# Patient Record
Sex: Female | Born: 2000 | Hispanic: Yes | Marital: Single | Smoking: Never smoker
Health system: Southern US, Community
[De-identification: ages and names within clinical notes are randomized; demographics above are authoritative.]

## PROBLEM LIST (undated history)

## (undated) ENCOUNTER — Inpatient Hospital Stay (HOSPITAL_COMMUNITY): Payer: Self-pay

## (undated) DIAGNOSIS — Z8719 Personal history of other diseases of the digestive system: Secondary | ICD-10-CM

## (undated) DIAGNOSIS — D649 Anemia, unspecified: Secondary | ICD-10-CM

## (undated) HISTORY — PX: APPENDECTOMY: SHX54

## (undated) HISTORY — DX: Personal history of other diseases of the digestive system: Z87.19

---

## 2019-04-23 ENCOUNTER — Encounter (HOSPITAL_COMMUNITY): Payer: Self-pay | Admitting: Emergency Medicine

## 2019-04-23 ENCOUNTER — Other Ambulatory Visit: Payer: Self-pay

## 2019-04-23 ENCOUNTER — Emergency Department (HOSPITAL_COMMUNITY)
Admission: EM | Admit: 2019-04-23 | Discharge: 2019-04-23 | Disposition: A | Discharge status: Home / Self Care | Payer: Medicaid Other | Attending: Emergency Medicine | Admitting: Emergency Medicine

## 2019-04-23 DIAGNOSIS — K0889 Other specified disorders of teeth and supporting structures: Secondary | ICD-10-CM | POA: Diagnosis present

## 2019-04-23 DIAGNOSIS — K029 Dental caries, unspecified: Secondary | ICD-10-CM | POA: Diagnosis not present

## 2019-04-23 MED ORDER — HYDROCODONE-ACETAMINOPHEN 5-325 MG PO TABS: 2.0000 | ORAL_TABLET | ORAL | 0 refills | Status: DC | PRN

## 2019-04-23 MED ORDER — CLINDAMYCIN HCL 150 MG PO CAPS: 150.0000 mg | ORAL_CAPSULE | Freq: Four times a day (QID) | ORAL | 0 refills | Status: DC

## 2019-04-23 MED ORDER — ACETAMINOPHEN 500 MG PO TABS
1000.0000 mg | ORAL_TABLET | Freq: Once | ORAL | Status: AC
  Administered 2019-04-23: 1000 mg via ORAL
  Filled 2019-04-23: qty 2

## 2019-04-23 MED ORDER — BUPIVACAINE-EPINEPHRINE 0.5% -1:200000 IJ SOLN
10.0000 mL | Freq: Once | INTRAMUSCULAR | Status: AC
  Administered 2019-04-23: 12.5 mg
  Filled 2019-04-23: qty 10

## 2019-04-23 NOTE — ED Triage Notes (Signed)
Pt reports dental pain X1 month.  She actually has surgery scheduled for tomorrow (Wednesday) to have her molars removed.  She has been taking antibiotic which she believes "worked at first but the infection came back."

## 2019-04-23 NOTE — ED Provider Notes (Signed)
[facility]ENC[facility] Provider Note   CSN: [id number] Arrival date & time: 04/23/19  0423     History   Chief Complaint Chief Complaint  Patient presents with  . Dental Pain    HPI Barbara PinaReyna Anabel Adelina Montgomery is a [age] y.o. female.     18 year old who presents for dental pain.  Patient with dental pain x1 month.  Patient has seen her dentist and was prescribed amoxicillin and ibuprofen.  Initially these medications have helped but now the pain is worsening and the swelling is more intense.  No known fevers.  No other complaints.  No abdominal pain, no vomiting.  No chest pain.  The history is provided by the patient. A language interpreter was used.  Dental Pain Location:  Upper and lower Upper teeth location:  15/LU 2nd molar Lower teeth location:  18/LL 2nd molar Quality:  Constant Severity:  Moderate Onset quality:  Gradual Timing:  Constant Progression:  Worsening Chronicity:  New Context: dental caries and poor dentition   Previous work-up:  Dental exam Relieved by:  None tried Worsened by:  Cold food/drink Associated symptoms: facial pain, facial swelling and gum swelling   Associated symptoms: no difficulty swallowing, no drooling, no fever, no headaches, no neck swelling, no oral bleeding, no oral lesions and no trismus     History reviewed. No pertinent past medical history.  There are no active problems to display for this patient.   History reviewed. No pertinent surgical history.   OB History   No obstetric history on file.      Home Medications    Prior to Admission medications   Medication Sig Start Date End Date Taking? Authorizing Provider  clindamycin (CLEOCIN) 150 MG capsule Take 1 capsule (150 mg total) by mouth every 6 (six) hours. 04/23/19   Niel HummerKuhner, Alee Katen, MD  HYDROcodone-acetaminophen (NORCO/VICODIN) 5-325 MG tablet Take 2 tablets by mouth every 4 (four) hours as needed. 04/23/19   Niel HummerKuhner, Arieliz Latino, MD    Family  History No family history on file.  Social History Social History   Tobacco Use  . Smoking status: Not on file  Substance Use Topics  . Alcohol use: Not on file  . Drug use: Not on file     Allergies   Patient has no known allergies.   Review of Systems Review of Systems  Constitutional: Negative for fever.  HENT: Positive for facial swelling. Negative for drooling and mouth sores.   Neurological: Negative for headaches.  All other systems reviewed and are negative.    Physical Exam Updated Vital Signs BP 106/67 (BP Location: Right Arm)   Pulse 62   Temp 98.4 F (36.9 C) (Oral)   Resp 16   Wt 47 kg   SpO2 100%   Physical Exam Vitals signs and nursing note reviewed.  Constitutional:      Appearance: She is well-developed.  HENT:     Head: Normocephalic and atraumatic.     Right Ear: External ear normal.     Left Ear: External ear normal.     Nose:     Comments: Severe dental caries on left upper second molar and left lower second molar and right upper second molar.  Mild Swelling and moderate tenderness to left upper face and left lower face.   Eyes:     Conjunctiva/sclera: Conjunctivae normal.  Neck:     Musculoskeletal: Normal range of motion and neck supple.  Cardiovascular:     Rate and Rhythm: Normal rate.  Heart sounds: Normal heart sounds.  Pulmonary:     Effort: Pulmonary effort is normal.     Breath sounds: Normal breath sounds.  Abdominal:     General: Bowel sounds are normal.     Palpations: Abdomen is soft.     Tenderness: There is no abdominal tenderness. There is no rebound.  Musculoskeletal: Normal range of motion.  Skin:    General: Skin is warm.  Neurological:     Mental Status: She is alert and oriented to person, place, and time.      ED Treatments / Results  Labs (all labs ordered are listed, but only abnormal results are displayed) Labs Reviewed - No data to display  EKG None  Radiology No results found.   Procedures Dental Block  Date/Time: 04/23/2019 7:09 AM Performed by: Niel Hummer, MD Authorized by: Niel Hummer, MD   Consent:    Consent obtained:  Verbal   Consent given by:  Patient   Risks discussed:  Nerve damage, swelling, unsuccessful block and pain   Alternatives discussed:  Alternative treatment Universal protocol:    Procedure explained and questions answered to patient or proxy's satisfaction: yes     Immediately prior to procedure, a time out was called: yes     Patient identity confirmed:  Verbally with patient and arm band Indications:    Indications: dental pain   Location:    Block type:  Inferior alveolar   Laterality:  Left Procedure details (see MAR for exact dosages):    Syringe type:  Controlled syringe   Needle gauge:  25 G   Anesthetic injected:  Bupivacaine 0.5% WITH epi and lidocaine 2% WITH epi   Injection procedure:  Anatomic landmarks identified, introduced needle, incremental injection, anatomic landmarks palpated and negative aspiration for blood Post-procedure details:    Outcome:  Pain relieved   Patient tolerance of procedure:  Tolerated well, no immediate complications Dental Block  Date/Time: 04/23/2019 7:11 AM Performed by: Niel Hummer, MD Authorized by: Niel Hummer, MD   Consent:    Consent obtained:  Verbal   Consent given by:  Patient Indications:    Indications: dental pain   Location:    Block type:  Anterior superior alveolar Procedure details (see MAR for exact dosages):    Needle gauge:  25 G   Anesthetic injected:  Bupivacaine 0.5% WITH epi and lidocaine 2% WITH epi   Injection procedure:  Anatomic landmarks identified, anatomic landmarks palpated, introduced needle, negative aspiration for blood and incremental injection Post-procedure details:    Outcome:  Pain relieved   (including critical care time)  Medications Ordered in ED Medications  bupivacaine-EPINEPHrine (MARCAINE W/ EPI) 0.5% -1:200000 (with pres)  injection 50 mg (has no administration in time range)  acetaminophen (TYLENOL) tablet 1,000 mg (1,000 mg Oral Given 04/23/19 0502)     Initial Impression / Assessment and Plan / ED Course  I have reviewed the triage vital signs and the nursing notes.  Pertinent labs & imaging results that were available during my care of the patient were reviewed by me and considered in my medical decision making (see chart for details).        18 year old with dental pain.  Patient with significant dental caries.  Patient on amoxicillin.  Patient on ibuprofen.  I have offered to do a dental block.  Patient would like dental block.  She is asked for it on the left upper and left lower sides.  Nerve blocks done.  Patient achieved anesthesia and  pain control.  Will discharge home on clindamycin and Norco.  Will have patient follow-up with dentist tomorrow as scheduled.  Discussed signs that warrant reevaluation.  Final Clinical Impressions(s) / ED Diagnoses   Final diagnoses:  Pain due to dental caries    ED Discharge Orders         Ordered    HYDROcodone-acetaminophen (NORCO/VICODIN) 5-325 MG tablet  Every 4 hours PRN     04/23/19 0706    clindamycin (CLEOCIN) 150 MG capsule  Every 6 hours     04/23/19 0706           Louanne Skye, MD 04/23/19 [phone]

## 2019-04-23 NOTE — ED Notes (Signed)
ED Provider at bedside.  Dr. Kuhner into see patient 

## 2019-08-14 ENCOUNTER — Ambulatory Visit: Payer: Medicaid Other | Admitting: Family Medicine

## 2019-08-14 NOTE — Progress Notes (Deleted)
    SUBJECTIVE:   CHIEF COMPLAINT / HPI:   ***  PERTINENT  PMH / PSH: ***  OBJECTIVE:   There were no vitals taken for this visit.  ***  ASSESSMENT/PLAN:   No problem-specific Assessment & Plan notes found for this encounter.     Aubrea Meixner Autry-Lott, DO  [facility]   

## 2019-10-02 ENCOUNTER — Emergency Department (HOSPITAL_COMMUNITY): Payer: Medicaid Other

## 2019-10-02 ENCOUNTER — Encounter (HOSPITAL_COMMUNITY): Payer: Self-pay | Admitting: Emergency Medicine

## 2019-10-02 ENCOUNTER — Emergency Department (HOSPITAL_COMMUNITY)
Admission: EM | Admit: 2019-10-02 | Discharge: 2019-10-02 | Disposition: A | Discharge status: Home / Self Care | Payer: Medicaid Other | Attending: Emergency Medicine | Admitting: Emergency Medicine

## 2019-10-02 DIAGNOSIS — S301XXA Contusion of abdominal wall, initial encounter: Secondary | ICD-10-CM | POA: Insufficient documentation

## 2019-10-02 DIAGNOSIS — R079 Chest pain, unspecified: Secondary | ICD-10-CM | POA: Insufficient documentation

## 2019-10-02 DIAGNOSIS — S3092XA Unspecified superficial injury of abdominal wall, initial encounter: Secondary | ICD-10-CM | POA: Diagnosis present

## 2019-10-02 DIAGNOSIS — Y999 Unspecified external cause status: Secondary | ICD-10-CM | POA: Diagnosis not present

## 2019-10-02 DIAGNOSIS — Y939 Activity, unspecified: Secondary | ICD-10-CM | POA: Insufficient documentation

## 2019-10-02 DIAGNOSIS — X58XXXA Exposure to other specified factors, initial encounter: Secondary | ICD-10-CM | POA: Diagnosis not present

## 2019-10-02 DIAGNOSIS — Y929 Unspecified place or not applicable: Secondary | ICD-10-CM | POA: Insufficient documentation

## 2019-10-02 LAB — URINALYSIS, ROUTINE W REFLEX MICROSCOPIC
Bacteria, UA: NONE SEEN
Bilirubin Urine: NEGATIVE
Glucose, UA: NEGATIVE mg/dL
Hgb urine dipstick: NEGATIVE
Ketones, ur: NEGATIVE mg/dL
Nitrite: NEGATIVE
Protein, ur: NEGATIVE mg/dL
Specific Gravity, Urine: 1.009 (ref 1.005–1.030)
pH: 8 (ref 5.0–8.0)

## 2019-10-02 LAB — CBC
HCT: 37.4 % (ref 36.0–46.0)
Hemoglobin: 11.2 g/dL — ABNORMAL LOW (ref 12.0–15.0)
MCH: 25.1 pg — ABNORMAL LOW (ref 26.0–34.0)
MCHC: 29.9 g/dL — ABNORMAL LOW (ref 30.0–36.0)
MCV: 83.9 fL (ref 80.0–100.0)
Platelets: 303 10*3/uL (ref 150–400)
RBC: 4.46 MIL/uL (ref 3.87–5.11)
RDW: 15.3 % (ref 11.5–15.5)
WBC: 6.7 10*3/uL (ref 4.0–10.5)
nRBC: 0 % (ref 0.0–0.2)

## 2019-10-02 LAB — BASIC METABOLIC PANEL
Anion gap: 10 (ref 5–15)
BUN: 9 mg/dL (ref 6–20)
CO2: 25 mmol/L (ref 22–32)
Calcium: 8.8 mg/dL — ABNORMAL LOW (ref 8.9–10.3)
Chloride: 104 mmol/L (ref 98–111)
Creatinine, Ser: 0.69 mg/dL (ref 0.44–1.00)
GFR calc Af Amer: >60 mL/min (ref >60)
GFR calc non Af Amer: >60 mL/min (ref >60)
Glucose, Bld: 92 mg/dL (ref 70–99)
Potassium: 4.1 mmol/L (ref 3.5–5.1)
Sodium: 139 mmol/L (ref 135–145)

## 2019-10-02 LAB — HEPATIC FUNCTION PANEL
ALT: 15 U/L (ref 0–44)
AST: 25 U/L (ref 15–41)
Albumin: 3.6 g/dL (ref 3.5–5.0)
Alkaline Phosphatase: 114 U/L (ref 38–126)
Bilirubin, Direct: 0.1 mg/dL (ref 0.0–0.2)
Indirect Bilirubin: 0.6 mg/dL (ref 0.3–0.9)
Total Bilirubin: 0.7 mg/dL (ref 0.3–1.2)
Total Protein: 6.3 g/dL — ABNORMAL LOW (ref 6.5–8.1)

## 2019-10-02 LAB — LIPASE, BLOOD: Lipase: 27 U/L (ref 11–51)

## 2019-10-02 LAB — TROPONIN I (HIGH SENSITIVITY): Troponin I (High Sensitivity): 3 ng/L (ref <18)

## 2019-10-02 LAB — I-STAT BETA HCG BLOOD, ED (MC, WL, AP ONLY): I-stat hCG, quantitative: <5 m[IU]/mL (ref <5)

## 2019-10-02 LAB — D-DIMER, QUANTITATIVE: D-Dimer, Quant: <0.27 ug/mL-FEU (ref 0.00–0.50)

## 2019-10-02 MED ORDER — ACETAMINOPHEN 325 MG PO TABS: 650.0000 mg | ORAL_TABLET | Freq: Once | ORAL | Status: DC

## 2019-10-02 MED ORDER — SODIUM CHLORIDE 0.9% FLUSH: 3.0000 mL | Freq: Once | INTRAVENOUS | Status: DC

## 2019-10-02 MED ORDER — KETOROLAC TROMETHAMINE 15 MG/ML IJ SOLN: 15.0000 mg | Freq: Once | INTRAMUSCULAR | Status: DC

## 2019-10-02 NOTE — ED Provider Notes (Signed)
[facility] EMERGENCY DEPARTMENT Provider Note   CSN: [id number] Arrival date & time: 10/02/19  9381     History No chief complaint on file.   Barbara Montgomery is a 19 y.o. female.  Patient that hurts with coughing, moving, breathing.  Patient has history of appendectomy in [place].  Patient moved here approximately 6 months ago and took a long bus ride over 2 days.  Patient denies any known blood clot history, known cardiac history.  Patient has been doing difficult job lifting things and leaning against her ribs and abdomen area which may have been contributing.  Patient has no history of bleeding disorders.  Patient denies any cough or fever.  No known Covid contacts.  Patient feels safe where she lives who is with family.  No history of abuse.        History reviewed. No pertinent past medical history.  There are no problems to display for this patient.   History reviewed. No pertinent surgical history.   OB History   No obstetric history on file.     History reviewed. No pertinent family history.  Social History   Tobacco Use  . Smoking status: Never Smoker  . Smokeless tobacco: Never Used  Substance Use Topics  . Alcohol use: Not on file  . Drug use: Not on file    Home Medications Prior to Admission medications   Medication Sig Start Date End Date Taking? Authorizing Provider  clindamycin (CLEOCIN) 150 MG capsule Take 1 capsule (150 mg total) by mouth every 6 (six) hours. 04/23/19   Louanne Skye, MD  HYDROcodone-acetaminophen (NORCO/VICODIN) 5-325 MG tablet Take 2 tablets by mouth every 4 (four) hours as needed. 04/23/19   Louanne Skye, MD    Allergies    Patient has no known allergies.  Review of Systems   Review of Systems  Constitutional: Negative for chills and fever.  HENT: Negative for congestion.   Eyes: Negative for visual disturbance.  Respiratory: Negative for shortness of breath.   Cardiovascular: Positive for  chest pain. Negative for leg swelling.  Gastrointestinal: Negative for abdominal pain and vomiting.  Genitourinary: Negative for dysuria and flank pain.  Musculoskeletal: Negative for back pain, neck pain and neck stiffness.  Skin: Positive for wound. Negative for rash.  Neurological: Negative for light-headedness and headaches.    Physical Exam Updated Vital Signs BP (!) 99/55 (BP Location: Left Arm)   Pulse (!) 50   Temp 97.8 F (36.6 C) (Oral)   Resp 13   SpO2 100%   Physical Exam Vitals and nursing note reviewed.  Constitutional:      Appearance: She is well-developed. She is not ill-appearing.  HENT:     Head: Normocephalic and atraumatic.     Mouth/Throat:     Mouth: Mucous membranes are moist.  Eyes:     General:        Right eye: No discharge.        Left eye: No discharge.     Conjunctiva/sclera: Conjunctivae normal.  Neck:     Trachea: No tracheal deviation.  Cardiovascular:     Rate and Rhythm: Regular rhythm. Bradycardia present.  Pulmonary:     Effort: Pulmonary effort is normal.     Breath sounds: Normal breath sounds.  Abdominal:     General: There is no distension.     Palpations: Abdomen is soft.     Tenderness: There is no abdominal tenderness. There is no guarding.  Musculoskeletal:  General: Tenderness present. No swelling.     Cervical back: Normal range of motion and neck supple. No rigidity.  Skin:    General: Skin is warm.     Capillary Refill: Capillary refill takes less than 2 seconds.     Findings: Bruising present. No rash.     Comments: Patient has multiple smaller bruises approximately 2 cm diameter right upper abdominal and epigastric region.  No focal tenderness at the sites.  Patient has mild tenderness palpation of right anterior lateral rib region no step-off.  Neurological:     General: No focal deficit present.     Mental Status: She is alert and oriented to person, place, and time.  Psychiatric:        Mood and Affect:  Mood normal.     ED Results / Procedures / Treatments   Labs (all labs ordered are listed, but only abnormal results are displayed) Labs Reviewed  BASIC METABOLIC PANEL - Abnormal; Notable for the following components:      Result Value   Calcium 8.8 (*)    All other components within normal limits  CBC - Abnormal; Notable for the following components:   Hemoglobin 11.2 (*)    MCH 25.1 (*)    MCHC 29.9 (*)    All other components within normal limits  URINALYSIS, ROUTINE W REFLEX MICROSCOPIC - Abnormal; Notable for the following components:   Color, Urine STRAW (*)    Leukocytes,Ua TRACE (*)    All other components within normal limits  D-DIMER, QUANTITATIVE (NOT AT [facility])  HEPATIC FUNCTION PANEL  LIPASE, BLOOD  I-STAT BETA HCG BLOOD, ED ([facility], [facility], [facility] ONLY)  TROPONIN I (HIGH SENSITIVITY)    EKG EKG Interpretation  Date/Time:  Wednesday Oct 02 2019 10:52:43 EDT Ventricular Rate:  52 PR Interval:  132 QRS Duration: 79 QT Interval:  410 QTC Calculation: 382 R Axis:   64 Text Interpretation: Sinus rhythm Borderline short PR interval Confirmed by Blane Ohara [phone]) on 10/02/2019 11:42:07 AM   Radiology DG Chest 2 View  Result Date: 10/02/2019 CLINICAL DATA:  Right-sided chest pain. EXAM: CHEST - 2 VIEW COMPARISON:  None. FINDINGS: The cardiac silhouette, mediastinal and hilar contours are normal. The lungs are clear. Artifact noted from EKG pads over the upper chest. The bony thorax is intact. IMPRESSION: No acute cardiopulmonary findings. Electronically Signed   By: Rudie Meyer M.D.   On: 10/02/2019 08:09    Procedures Procedures (including critical care time)  Medications Ordered in ED Medications  sodium chloride flush (NS) 0.9 % injection 3 mL (has no administration in time range)  acetaminophen (TYLENOL) tablet 650 mg (650 mg Oral Not Given 10/02/19 1139)    ED Course  I have reviewed the triage vital signs and the nursing notes.  Pertinent labs & imaging  results that were available during my care of the patient were reviewed by me and considered in my medical decision making (see chart for details).    MDM Rules/Calculators/[name]                      Patient presents with right-sided chest pain with differential including musculoskeletal especially since it is reproducible and worse with movement and breathing along with the bruising, with recent long bus trip blood clot however she has no history or other reasons for blood clots, pneumonia however no productive cough or fever, other. Blood work was reviewed Hb 11.2, normal white blood cell count, normal troponin. D-dimer sent  and reviewed normal. EKG initially tachycardic and pain improved and repeat EKG mild bradycardia which is likely her baseline however no old to compare to. A translator was used for discussion and female representative for right breast examination which revealed no tenderness or signs of infection or masses palpated. Urinalysis no sign of significant infection. Myself in addition to nursing staff on 2 different occasions ask questions to ensure her safety and she says she feels safe where she lives she lives with family, she understands that if anyone is trying to hurt her she can get help and she had appropriate response to these questions. Chest x-ray reviewed no acute abnormalities. Patient stable for close outpatient follow-up, stressed follow-up with family medicine as she will need continued care and reassessment. Final Clinical Impression(s) / ED Diagnoses Final diagnoses:  Right-sided chest pain  Traumatic ecchymosis of abdominal wall, initial encounter    Rx / DC Orders ED Discharge Orders    None       Blane Ohara, MD 10/06/19 2355

## 2019-10-02 NOTE — Discharge Instructions (Addendum)
It is very important for you to follow-up with your primary care doctor for further evaluation of your pain and bruising.  If you develop significant shortness of breath, persistent fevers, passout or new concerns return to the emergency department.  Es muy importante que realice un seguimiento con su mdico de atencin primaria para una evaluacin adicional de su dolor y hematomas. Si presenta falta de aire significativa, fiebres persistentes, desmayos o nuevas preocupaciones, regrese al departamento de emergencias.

## 2019-10-02 NOTE — ED Triage Notes (Signed)
Pt here with c/o right side chest pain hurts when she coughs and takes a deep breath she also feels like they make be a small bump under her right breast

## 2020-05-16 NOTE — L&D Delivery Note (Addendum)
Delivery Note:   G1P0 at [redacted]w[redacted]d  Admitting diagnosis: Cholestasis during pregnancy in third trimester [O26.613, K83.1]  Risks:  Cholestasis Iron Deficiency Anemia  Onset of labor: 0636 IOL/Augmentation: Cytotec, Pitocin and IP Foley ROM: unknown  Complete dilation at 04/04/2021  0744 Onset of pushing at 0745 FHR second stage 165  Analgesia /Anesthesia intrapartum:Epidural  Pushing in lithotomy position with CNM and [name] staff support, significant other and mother present for birth and supportive.  Delivery of a Live born female  Birth Weight:   APGAR: 8, 9  Newborn Delivery   Birth date/time: 04/04/2021 08:29:00 Delivery type: Vaginal, Spontaneous      in cephalic presentation, ROA.  APGAR:1 min-8 , 5 min-9   Nuchal Cord: No  Cord double clamped after cessation of pulsation, cut by father.  Collection of cord blood for typing completed. Cord blood donation-None  Arterial cord blood sample-No    Placenta delivered-Spontaneous;Pathology  with 3 vessels . Uterotonics: Pitocin Placenta to pathology  Uterine tone Firm  Bleeding Mild  1st degree  laceration identified.  Episiotomy:None  Local analgesia: Epidural  Repair: 3.0 suture  Est. Blood Loss (mL):300.00   Complications: Intrauterine Inflammation or infection (Chorioamniotis)  Mom to postpartum.  Baby girl to Couplet care / Skin to Skin.  Delivery Report:  Review the Delivery Report for details.    Trellis Moment, SNM 04/04/2021, 8:57 AM   I was gloved and present for delivery in its entirety.  Second stage of labor progressed, baby delivered after 30 minutes. Slight maternal effort dysotica with delivery of head but not shoulder dystocia.    Complications: none  Lacerations: 1st degree perineal, repaired with my guidance by SNM  EBL: 300  Rolm Bookbinder, CNM 11:03 AM

## 2020-09-01 ENCOUNTER — Encounter (HOSPITAL_COMMUNITY): Payer: Self-pay | Admitting: Obstetrics and Gynecology

## 2020-09-01 ENCOUNTER — Other Ambulatory Visit: Payer: Self-pay

## 2020-09-01 ENCOUNTER — Inpatient Hospital Stay (HOSPITAL_COMMUNITY): Payer: Medicaid Other

## 2020-09-01 ENCOUNTER — Inpatient Hospital Stay (HOSPITAL_COMMUNITY)
Admission: AD | Admit: 2020-09-01 | Discharge: 2020-09-01 | Disposition: A | Discharge status: Home / Self Care | Payer: Medicaid Other | Attending: Obstetrics and Gynecology | Admitting: Obstetrics and Gynecology

## 2020-09-01 DIAGNOSIS — O26891 Other specified pregnancy related conditions, first trimester: Secondary | ICD-10-CM | POA: Diagnosis not present

## 2020-09-01 DIAGNOSIS — R300 Dysuria: Secondary | ICD-10-CM | POA: Diagnosis not present

## 2020-09-01 DIAGNOSIS — R42 Dizziness and giddiness: Secondary | ICD-10-CM | POA: Diagnosis not present

## 2020-09-01 DIAGNOSIS — R1013 Epigastric pain: Secondary | ICD-10-CM | POA: Diagnosis not present

## 2020-09-01 DIAGNOSIS — R102 Pelvic and perineal pain: Secondary | ICD-10-CM | POA: Diagnosis not present

## 2020-09-01 DIAGNOSIS — Z3A01 Less than 8 weeks gestation of pregnancy: Secondary | ICD-10-CM | POA: Diagnosis not present

## 2020-09-01 DIAGNOSIS — O219 Vomiting of pregnancy, unspecified: Secondary | ICD-10-CM | POA: Diagnosis not present

## 2020-09-01 DIAGNOSIS — R350 Frequency of micturition: Secondary | ICD-10-CM | POA: Insufficient documentation

## 2020-09-01 DIAGNOSIS — Z8719 Personal history of other diseases of the digestive system: Secondary | ICD-10-CM | POA: Insufficient documentation

## 2020-09-01 DIAGNOSIS — R109 Unspecified abdominal pain: Secondary | ICD-10-CM | POA: Diagnosis not present

## 2020-09-01 LAB — COMPREHENSIVE METABOLIC PANEL
ALT: 13 U/L (ref 0–44)
AST: 20 U/L (ref 15–41)
Albumin: 3.8 g/dL (ref 3.5–5.0)
Alkaline Phosphatase: 57 U/L (ref 38–126)
Anion gap: 9 (ref 5–15)
BUN: 5 mg/dL — ABNORMAL LOW (ref 6–20)
CO2: 23 mmol/L (ref 22–32)
Calcium: 9.1 mg/dL (ref 8.9–10.3)
Chloride: 101 mmol/L (ref 98–111)
Creatinine, Ser: 0.51 mg/dL (ref 0.44–1.00)
GFR, Estimated: >60 mL/min (ref >60)
Glucose, Bld: 85 mg/dL (ref 70–99)
Potassium: 3.8 mmol/L (ref 3.5–5.1)
Sodium: 133 mmol/L — ABNORMAL LOW (ref 135–145)
Total Bilirubin: 0.7 mg/dL (ref 0.3–1.2)
Total Protein: 6.5 g/dL (ref 6.5–8.1)

## 2020-09-01 LAB — URINALYSIS, ROUTINE W REFLEX MICROSCOPIC
Bilirubin Urine: NEGATIVE
Glucose, UA: NEGATIVE mg/dL
Hgb urine dipstick: NEGATIVE
Ketones, ur: 80 mg/dL — AB
Leukocytes,Ua: NEGATIVE
Nitrite: NEGATIVE
Protein, ur: NEGATIVE mg/dL
Specific Gravity, Urine: 1.016 (ref 1.005–1.030)
pH: 7 (ref 5.0–8.0)

## 2020-09-01 LAB — CBC
HCT: 38.7 % (ref 36.0–46.0)
Hemoglobin: 12.3 g/dL (ref 12.0–15.0)
MCH: 25.9 pg — ABNORMAL LOW (ref 26.0–34.0)
MCHC: 31.8 g/dL (ref 30.0–36.0)
MCV: 81.6 fL (ref 80.0–100.0)
Platelets: 323 10*3/uL (ref 150–400)
RBC: 4.74 MIL/uL (ref 3.87–5.11)
RDW: 14.3 % (ref 11.5–15.5)
WBC: 9.4 10*3/uL (ref 4.0–10.5)
nRBC: 0 % (ref 0.0–0.2)

## 2020-09-01 LAB — ABO/RH: ABO/RH(D): O POS

## 2020-09-01 LAB — HCG, QUANTITATIVE, PREGNANCY: hCG, Beta Chain, Quant, S: 93287 m[IU]/mL — ABNORMAL HIGH (ref <5)

## 2020-09-01 LAB — WET PREP, GENITAL
Sperm: NONE SEEN
Trich, Wet Prep: NONE SEEN
Yeast Wet Prep HPF POC: NONE SEEN

## 2020-09-01 LAB — OB RESULTS CONSOLE ANTIBODY SCREEN: Antibody Screen: NEGATIVE

## 2020-09-01 LAB — LIPASE, BLOOD: Lipase: 25 U/L (ref 11–51)

## 2020-09-01 MED ORDER — FAMOTIDINE IN NACL 20-0.9 MG/50ML-% IV SOLN
20.0000 mg | Freq: Once | INTRAVENOUS | Status: AC
  Administered 2020-09-01: 20 mg via INTRAVENOUS
  Filled 2020-09-01: qty 50

## 2020-09-01 MED ORDER — METOCLOPRAMIDE HCL 5 MG/ML IJ SOLN
10.0000 mg | Freq: Once | INTRAMUSCULAR | Status: AC
  Administered 2020-09-01: 10 mg via INTRAVENOUS
  Filled 2020-09-01: qty 2

## 2020-09-01 MED ORDER — PROMETHAZINE HCL 25 MG PO TABS: 25.0000 mg | ORAL_TABLET | Freq: Three times a day (TID) | ORAL | 0 refills | Status: DC | PRN

## 2020-09-01 MED ORDER — FAMOTIDINE 20 MG PO TABS: 20.0000 mg | ORAL_TABLET | Freq: Every day | ORAL | 1 refills | Status: DC

## 2020-09-01 MED ORDER — SODIUM CHLORIDE 0.9 % IV BOLUS
1000.0000 mL | Freq: Once | INTRAVENOUS | Status: AC
  Administered 2020-09-01: 1000 mL via INTRAVENOUS

## 2020-09-01 MED ORDER — DOXYLAMINE-PYRIDOXINE 10-10 MG PO TBEC: DELAYED_RELEASE_TABLET | ORAL | 0 refills | Status: DC

## 2020-09-01 NOTE — MAU Provider Note (Signed)
Chief Complaint:  Abdominal Pain, Nausea, Emesis, and Dizziness  Spanish interpreter in person used for the duration of the visit.    HPI: Barbara Montgomery is a 19 y.o. G1P0 at [redacted]w[redacted]d via LMP who presents to maternity admissions reporting abdominal pain. Denies vaginal bleeding, fever, falls, or recent illness. Accompanied by her partner.   Woke up this morning with lightheadedness and vomited x2 (Clear and foamy). Upper (burning, radiates up her chest) x 3 days/worsening and suprapubic non-radiating constant abdominal pain ("like something walking on top or sharp") for past three weeks. Still nauseous, last vomited around 0800. She has not eaten or drank anything since then. Nausea since yesterday, vomiting only today. Has been nauseous for a few weeks. Upper ab pain intermittent, worse in the morning, not specifically worse after eating.  +Dysuria for the past three weeks, urinary frequency. Denies urgency, fever, or back pain. Has not tried any medications or anything other modalities to make it better. No provoking symptoms she is aware of.  No vaginal itching/irritation, or changes in vaginal discharge.   Previous had a similar upper abdominal pain when she lived in [place], told it was gastritis.   Will receive pre-natal care at [facility]. First appt 10/02/2020. No U/S.   Pregnancy Course:   History reviewed. No pertinent past medical history. OB History  Gravida Para Term Preterm AB Living  1            SAB IAB Ectopic Multiple Live Births               # Outcome Date GA Lbr Len/2nd Weight Sex Delivery Anes PTL Lv  1 Current            Past Surgical History:  Procedure Laterality Date  . APPENDECTOMY     History reviewed. No pertinent family history. Social History   Tobacco Use  . Smoking status: Never Smoker  . Smokeless tobacco: Never Used  Vaping Use  . Vaping Use: Never used  Substance Use Topics  . Alcohol use: Never  . Drug use: Never   No Known  Allergies Medications Prior to Admission  Medication Sig Dispense Refill Last Dose  . clindamycin (CLEOCIN) 150 MG capsule Take 1 capsule (150 mg total) by mouth every 6 (six) hours. 28 capsule 0   . HYDROcodone-acetaminophen (NORCO/VICODIN) 5-325 MG tablet Take 2 tablets by mouth every 4 (four) hours as needed. 10 tablet 0     I have reviewed patient's Past Medical Hx, Surgical Hx, Family Hx, Social Hx, medications and allergies.   ROS:  Review of Systems  Constitutional: Negative for chills, fatigue and fever.  Cardiovascular: Negative for chest pain and palpitations.  Gastrointestinal: Positive for abdominal pain, nausea and vomiting. Negative for constipation and diarrhea.  Genitourinary: Positive for dysuria, frequency and pelvic pain. Negative for flank pain, hematuria, urgency, vaginal bleeding and vaginal discharge.  Musculoskeletal: Negative for back pain.  Neurological: Positive for light-headedness. Negative for syncope, weakness and headaches.    Physical Exam   Patient Vitals for the past 24 hrs:  BP Temp Temp src Pulse Resp SpO2 Weight  09/01/20 1229 (!) 109/42 -- -- 79 -- -- --  09/01/20 1212 (!) 100/52 98 F (36.7 C) Oral 77 16 100 % --  09/01/20 1209 -- -- -- -- -- -- 52.7 kg  09/01/20 1046 (!) 106/45 (!) 97.5 F (36.4 C) Oral 88 16 100 % --    Constitutional: Well-developed, well-nourished female in no acute distress.  HEENT: MM  slightly dry  Cardiovascular: normal rate & rhythm, no murmur Respiratory: normal effort, lung sounds clear throughout GI: Abd soft and non-distended. Mild epigastric tenderness to deep palpation without rebounding or guarding. Tender to suprapubic region with voluntary guarding, no rebounding or involuntary guarding. No peritoneal signs. No murphy's or Mcburney's point tenderness. Negative roving's.  MS: Extremities nontender, no edema, normal ROM Neurologic: Alert and oriented x 4.  GU: no CVA tenderness b/l     Fetal Tracing: N/A    Labs: Results for orders placed or performed during the hospital encounter of 09/01/20 (from the past 24 hour(s))  Urinalysis, Routine w reflex microscopic Urine, Clean Catch     Status: Abnormal   Collection Time: 09/01/20  1:20 PM  Result Value Ref Range   Color, Urine YELLOW YELLOW   APPearance HAZY (A) CLEAR   Specific Gravity, Urine 1.016 1.005 - 1.030   pH 7.0 5.0 - 8.0   Glucose, UA NEGATIVE NEGATIVE mg/dL   Hgb urine dipstick NEGATIVE NEGATIVE   Bilirubin Urine NEGATIVE NEGATIVE   Ketones, ur 80 (A) NEGATIVE mg/dL   Protein, ur NEGATIVE NEGATIVE mg/dL   Nitrite NEGATIVE NEGATIVE   Leukocytes,Ua NEGATIVE NEGATIVE  Wet prep, genital     Status: Abnormal   Collection Time: 09/01/20  1:40 PM   Specimen: PATH Cytology Cervicovaginal Ancillary Only  Result Value Ref Range   Yeast Wet Prep HPF POC NONE SEEN NONE SEEN   Trich, Wet Prep NONE SEEN NONE SEEN   Clue Cells Wet Prep HPF POC PRESENT (A) NONE SEEN   WBC, Wet Prep HPF POC MANY (A) NONE SEEN   Sperm NONE SEEN   CBC     Status: Abnormal   Collection Time: 09/01/20  2:04 PM  Result Value Ref Range   WBC 9.4 4.0 - 10.5 K/uL   RBC 4.74 3.87 - 5.11 MIL/uL   Hemoglobin 12.3 12.0 - 15.0 g/dL   HCT 80.8 81.1 - 03.1 %   MCV 81.6 80.0 - 100.0 fL   MCH 25.9 (L) 26.0 - 34.0 pg   MCHC 31.8 30.0 - 36.0 g/dL   RDW 59.4 58.5 - 92.9 %   Platelets 323 150 - 400 K/uL   nRBC 0.0 0.0 - 0.2 %  ABO/Rh     Status: None   Collection Time: 09/01/20  2:04 PM  Result Value Ref Range   ABO/RH(D) O POS    No rh immune globuloin      NOT A RH IMMUNE GLOBULIN CANDIDATE, PT RH POSITIVE Performed at [facility], [address] [place], [place] [address]   hCG, quantitative, pregnancy     Status: Abnormal   Collection Time: 09/01/20  2:04 PM  Result Value Ref Range   hCG, Beta Chain, Quant, S 93,287 (H) <5 mIU/mL  Comprehensive metabolic panel     Status: Abnormal   Collection Time: 09/01/20  2:04 PM  Result Value Ref Range    Sodium 133 (L) 135 - 145 mmol/L   Potassium 3.8 3.5 - 5.1 mmol/L   Chloride 101 98 - 111 mmol/L   CO2 23 22 - 32 mmol/L   Glucose, Bld 85 70 - 99 mg/dL   BUN 5 (L) 6 - 20 mg/dL   Creatinine, Ser 8.63 0.44 - 1.00 mg/dL   Calcium 9.1 8.9 - 81.7 mg/dL   Total Protein 6.5 6.5 - 8.1 g/dL   Albumin 3.8 3.5 - 5.0 g/dL   AST 20 15 - 41 U/L  ALT 13 0 - 44 U/L   Alkaline Phosphatase 57 38 - 126 U/L   Total Bilirubin 0.7 0.3 - 1.2 mg/dL   GFR, Estimated >99 >24 mL/min   Anion gap 9 5 - 15  Lipase, blood     Status: None   Collection Time: 09/01/20  2:04 PM  Result Value Ref Range   Lipase 25 11 - 51 U/L    Imaging:  US OB LESS THAN 14 WEEKS WITH OB TRANSVAGINAL  Result Date: 09/01/2020 CLINICAL DATA:  Abdominal pain in first trimester of pregnancy, LMP 07/18/2020 EXAM: OBSTETRIC <14 WK [place] AND TRANSVAGINAL OB US TECHNIQUE: Both transabdominal and transvaginal ultrasound examinations were performed for complete evaluation of the gestation as well as the maternal uterus, adnexal regions, and pelvic cul-de-sac. Transvaginal technique was performed to assess early pregnancy. COMPARISON:  None FINDINGS: Intrauterine gestational sac: Present, single Yolk sac:  Present Embryo:  Present Cardiac Activity: Present Heart Rate: 127 bpm CRL:  6.4 mm   6 w   3 d                  [place] EDC: 04/24/2021 Subchorionic hemorrhage:  Small subchronic hemorrhage Maternal uterus/adnexae: Remainder of uterus unremarkable. Ovaries normal appearance. No free pelvic fluid or adnexal masses. IMPRESSION: Single live intrauterine gestation at 6 weeks 3 days EGA by crown-rump length. Small subchronic hemorrhage. Electronically Signed   By: Ulyses Southward M.D.   On: 09/01/2020 15:01    MAU Course/MDM: Orders Placed This Encounter  Procedures  . Wet prep, genital  . OB Urine Culture  . US OB LESS THAN 14 WEEKS WITH OB TRANSVAGINAL  . Urinalysis, Routine w reflex microscopic  . CBC  . hCG, quantitative, pregnancy  . Comprehensive  metabolic panel  . Lipase, blood  . Diet NPO time specified  . ABO/Rh   Meds ordered this encounter  Medications  . sodium chloride 0.9 % bolus 1,000 mL  . metoCLOPramide (REGLAN) injection 10 mg  . famotidine (PEPCID) IVPB 20 mg premix  . Doxylamine-Pyridoxine 10-10 MG TBEC    Sig: Take 2 tabs at bedtime. If needed, add another tab in the morning. If needed, add another tab in the afternoon, up to 4 tabs/day.    Dispense:  60 tablet    Refill:  0    DAW 9  . promethazine (PHENERGAN) 25 MG tablet    Sig: Take 1 tablet (25 mg total) by mouth every 8 (eight) hours as needed for refractory nausea / vomiting.    Dispense:  15 tablet    Refill:  0  . famotidine (PEPCID) 20 MG tablet    Sig: Take 1 tablet (20 mg total) by mouth daily.    Dispense:  30 tablet    Refill:  1   1600: Recheck patient after medication given, accompanied by spanish interpreter. Upper abdominal pain resolved, minimal suprapubic remaining. No longer nauseous. Requests crackers, tolerated graham crackers and salines without emesis. No emesis since MAU arrival. Abdomen soft without tenderness on repeat exam.    Assessment/Plan: 1. Abdominal pain during pregnancy in first trimester   2. Nausea and vomiting in pregnancy    #Acute epigastric abdominal pain, associated N/V: Suspect likely pregnancy related N/V with GERD. Reassuring lipase and hepatic function WNL, suggesting against pancreatitis/liver dysfunction. Doubt gallbladder etiology given description and location. Evidence of dehydration with U/A (ketones 80, no glucose) without electrolyte abnormality and appears mildly dehydrated on exam, given IVF bolus, reglan, and pepcid with resolution. Rx'd  Diclegis to start tonight, PRN phenergan for severe N/V, and pepcid daily for reflux. Tums PRN.   #Suprapubic abdominal pain: Subacute. Non-acute abdomen. U/A not consistent with infection, however will send for culture, dehydration likely contributing to dysuria.  Intrauterine pregnancy ([id number]) confirmed on U/S, consistent with LMP. Wet prep +clue cells, however asymptomatic, will hold off on treatment. F/u Gc/ch, low suspicion. Pain resolved following intervention as above.    Discharged home in stable condition, partner present to drive patient home. MAU precautions discussed, f/u with [facility] for prenatal care.     Allergies as of 09/01/2020   No Known Allergies     Medication List    STOP taking these medications   clindamycin 150 MG capsule Commonly known as: CLEOCIN   HYDROcodone-acetaminophen 5-325 MG tablet Commonly known as: NORCO/VICODIN     TAKE these medications   Doxylamine-Pyridoxine 10-10 MG Tbec Take 2 tabs at bedtime. If needed, add another tab in the morning. If needed, add another tab in the afternoon, up to 4 tabs/day.   famotidine 20 MG tablet Commonly known as: PEPCID Take 1 tablet (20 mg total) by mouth daily.   promethazine 25 MG tablet Commonly known as: PHENERGAN Take 1 tablet (25 mg total) by mouth every 8 (eight) hours as needed for refractory nausea / vomiting.       Leticia PennaSamantha Shakiyah Cirilo, DO  Family Medicine PGY-3

## 2020-09-01 NOTE — Discharge Instructions (Signed)
If any severe abdominal pain, inability to maintain hydration (recurrent nausea/vomiting and not keeping any fluids or food down), or vaginal bleeding- please return to MAU.   Si tiene dolor abdominal intenso, incapacidad para Photographer hidratacin (nuseas/vmitos recurrentes y falta de retencin de lquidos o alimentos) o sangrado vaginal, regrese a MAU.  Dolor abdominal durante el embarazo Abdominal Pain During Pregnancy El dolor de vientre (abdominal) es habitual durante el embarazo. Hay muchas causas posibles. Algunas causas son ms graves que otras. Algunas veces, la causa no se conoce. Siempre comunquese con su mdico si tiene dolor abdominal. Siga estas instrucciones en su casa:  No tenga relaciones sexuales ni se coloque nada dentro de la vagina hasta que el dolor haya desaparecido completamente.  Descanse todo lo que pueda [employer] dolor se le haya calmado.  Beba suficiente lquido para Radio producer pis (orina) de color amarillo plido.  Use los medicamentos de venta libre y los recetados solamente como se lo haya indicado el mdico.  Cumpla con todas las visitas de seguimiento.   Comunquese con un mdico si:  Sigue sintiendo dolor despus de descansar.  El dolor empeora despus de descansar.  Siente dolor en la parte inferior del abdomen que: ? Va y viene en intervalos regulares. ? Se extiende a la espalda. ? Se siente como clicos menstruales.  Siente dolor o ardor al hacer pis (orinar). Solicite ayuda de inmediato si:  Tiene fiebre o escalofros.  Siente que le Air traffic controller.  Tiene sangrado vaginal.  Tiene prdida de lquidos o tejidos por la vagina.  Vomita durante ms de 24horas.  Tiene heces lquidas (diarrea) durante ms de 24horas.  El beb se mueve menos de lo habitual.  Se siente dbil o se desmaya.  Tiene dolor muy intenso en la parte superior del abdomen. Resumen  El dolor de vientre es habitual durante el [place]. Hay muchas  causas posibles.  Si tiene Surveyor, quantity abdomen durante el embarazo, avise al mdico de inmediato.  Cumpla con todas las visitas de seguimiento. Esta informacin no tiene Theme park manager el consejo del mdico. Asegrese de hacerle al mdico cualquier pregunta que tenga. Document Revised: 02/20/2020 Document Reviewed: 02/20/2020 Elsevier Patient Education  2021 [employer].

## 2020-09-01 NOTE — MAU Provider Note (Signed)
History     CSN: [id number]  Arrival date and time: 09/01/20 1037   Event Date/Time   First Provider Initiated Contact with Patient 09/01/20 1258     *Spanish interpreter at bedside for this encounter*  Chief Complaint  Patient presents with  . Abdominal Pain  . Nausea  . Emesis  . Dizziness   HPI  Barbara Montgomery is a 20 y.o. G1P0 at [redacted]w[redacted]d who presents with n/v & abdominal cramping. Reports vomiting twice today. Does not have antiemetic at home. Also endorses some epigastric burning associated with the nausea & vomiting. Has felt dizzy today since vomiting but denies syncopal episode.  Reports intermittent lower abdominal cramping for the last 3 days. Rates pain 7/10. Hasn't treated symptoms. Nothing makes better or worse. Denies dysuria, vaginal bleeding, vaginal discharge, or fever. Going to [facility] for prenatal care.   OB History    Gravida  1   Para      Term      Preterm      AB      Living        SAB      IAB      Ectopic      Multiple      Live Births              History reviewed. No pertinent past medical history.  Past Surgical History:  Procedure Laterality Date  . APPENDECTOMY      History reviewed. No pertinent family history.  Social History   Tobacco Use  . Smoking status: Never Smoker  . Smokeless tobacco: Never Used  Vaping Use  . Vaping Use: Never used  Substance Use Topics  . Alcohol use: Never  . Drug use: Never    Allergies: No Known Allergies  Medications Prior to Admission  Medication Sig Dispense Refill Last Dose  . clindamycin (CLEOCIN) 150 MG capsule Take 1 capsule (150 mg total) by mouth every 6 (six) hours. 28 capsule 0   . HYDROcodone-acetaminophen (NORCO/VICODIN) 5-325 MG tablet Take 2 tablets by mouth every 4 (four) hours as needed. 10 tablet 0     Review of Systems  Constitutional: Negative.   Gastrointestinal: Positive for abdominal pain, nausea and vomiting. Negative for constipation and  diarrhea.  Genitourinary: Negative.   Neurological: Positive for dizziness. Negative for syncope.   Physical Exam   Blood pressure (!) 109/42, pulse 79, temperature 98 F (36.7 C), temperature source Oral, resp. rate 16, weight 52.7 kg, last menstrual period 07/18/2020, SpO2 100 %.  Physical Exam Vitals and nursing note reviewed.  Constitutional:      General: She is not in acute distress.    Appearance: She is well-developed and normal weight.  HENT:     Head: Normocephalic and atraumatic.  Pulmonary:     Effort: Pulmonary effort is normal. No respiratory distress.  Skin:    General: Skin is warm and dry.  Neurological:     Mental Status: She is alert.  Psychiatric:        Mood and Affect: Mood normal.        Behavior: Behavior normal.     MAU Course  Procedures Results for orders placed or performed during the hospital encounter of 09/01/20 (from the past 24 hour(s))  Urinalysis, Routine w reflex microscopic Urine, Clean Catch     Status: Abnormal   Collection Time: 09/01/20  1:20 PM  Result Value Ref Range   Color, Urine YELLOW YELLOW   APPearance HAZY (  A) CLEAR   Specific Gravity, Urine 1.016 1.005 - 1.030   pH 7.0 5.0 - 8.0   Glucose, UA NEGATIVE NEGATIVE mg/dL   Hgb urine dipstick NEGATIVE NEGATIVE   Bilirubin Urine NEGATIVE NEGATIVE   Ketones, ur 80 (A) NEGATIVE mg/dL   Protein, ur NEGATIVE NEGATIVE mg/dL   Nitrite NEGATIVE NEGATIVE   Leukocytes,Ua NEGATIVE NEGATIVE  Wet prep, genital     Status: Abnormal   Collection Time: 09/01/20  1:40 PM   Specimen: PATH Cytology Cervicovaginal Ancillary Only  Result Value Ref Range   Yeast Wet Prep HPF POC NONE SEEN NONE SEEN   Trich, Wet Prep NONE SEEN NONE SEEN   Clue Cells Wet Prep HPF POC PRESENT (A) NONE SEEN   WBC, Wet Prep HPF POC MANY (A) NONE SEEN   Sperm NONE SEEN   CBC     Status: Abnormal   Collection Time: 09/01/20  2:04 PM  Result Value Ref Range   WBC 9.4 4.0 - 10.5 K/uL   RBC 4.74 3.87 - 5.11  MIL/uL   Hemoglobin 12.3 12.0 - 15.0 g/dL   HCT 46.2 70.3 - 50.0 %   MCV 81.6 80.0 - 100.0 fL   MCH 25.9 (L) 26.0 - 34.0 pg   MCHC 31.8 30.0 - 36.0 g/dL   RDW 93.8 18.2 - 99.3 %   Platelets 323 150 - 400 K/uL   nRBC 0.0 0.0 - 0.2 %  ABO/Rh     Status: None   Collection Time: 09/01/20  2:04 PM  Result Value Ref Range   ABO/RH(D) O POS    No rh immune globuloin      NOT A RH IMMUNE GLOBULIN CANDIDATE, PT RH POSITIVE Performed at [facility], [address] [place], [place] [address]   hCG, quantitative, pregnancy     Status: Abnormal   Collection Time: 09/01/20  2:04 PM  Result Value Ref Range   hCG, Beta Chain, Quant, S 93,287 (H) <5 mIU/mL  Comprehensive metabolic panel     Status: Abnormal   Collection Time: 09/01/20  2:04 PM  Result Value Ref Range   Sodium 133 (L) 135 - 145 mmol/L   Potassium 3.8 3.5 - 5.1 mmol/L   Chloride 101 98 - 111 mmol/L   CO2 23 22 - 32 mmol/L   Glucose, Bld 85 70 - 99 mg/dL   BUN 5 (L) 6 - 20 mg/dL   Creatinine, Ser 7.89 0.44 - 1.00 mg/dL   Calcium 9.1 8.9 - 38.1 mg/dL   Total Protein 6.5 6.5 - 8.1 g/dL   Albumin 3.8 3.5 - 5.0 g/dL   AST 20 15 - 41 U/L   ALT 13 0 - 44 U/L   Alkaline Phosphatase 57 38 - 126 U/L   Total Bilirubin 0.7 0.3 - 1.2 mg/dL   GFR, Estimated >01 >75 mL/min   Anion gap 9 5 - 15  Lipase, blood     Status: None   Collection Time: 09/01/20  2:04 PM  Result Value Ref Range   Lipase 25 11 - 51 U/L   US OB LESS THAN 14 WEEKS WITH OB TRANSVAGINAL  Result Date: 09/01/2020 CLINICAL DATA:  Abdominal pain in first trimester of pregnancy, LMP 07/18/2020 EXAM: OBSTETRIC <14 WK [place] AND TRANSVAGINAL OB US TECHNIQUE: Both transabdominal and transvaginal ultrasound examinations were performed for complete evaluation of the gestation as well as the maternal uterus, adnexal regions, and pelvic cul-de-sac. Transvaginal technique was performed to assess early pregnancy. COMPARISON:  None FINDINGS: Intrauterine gestational sac:  Present, single Yolk sac:  Present Embryo:  Present Cardiac Activity: Present Heart Rate: 127 bpm CRL:  6.4 mm   6 w   3 d                  [place] EDC: 04/24/2021 Subchorionic hemorrhage:  Small subchronic hemorrhage Maternal uterus/adnexae: Remainder of uterus unremarkable. Ovaries normal appearance. No free pelvic fluid or adnexal masses. IMPRESSION: Single live intrauterine gestation at 6 weeks 3 days EGA by crown-rump length. Small subchronic hemorrhage. Electronically Signed   By: Ulyses Southward M.D.   On: 09/01/2020 15:01    MDM +UPT UA, wet prep, GC/chlamydia, CBC, ABO/Rh, quant hCG, and [place] today to rule out ectopic pregnancy which can be life threatening.  Ultrasound shows live IUP c/w LMP dating.   IV fluids, pepcid, & phenergan given in MAU. Pt reports improvement in n/v, heartburn, and abdominal cramping. Will prescribe antiemetic & antiacid for at home use.    Assessment and Plan   1. Abdominal pain during pregnancy in first trimester   2. Nausea and vomiting in pregnancy    -Rx diclegis, phenergan, & pepcid  -reviewed reasons to return to MAU  Judeth Horn 09/01/2020, 5:15 PM

## 2020-09-01 NOTE — ED Provider Notes (Signed)
Emergency Medicine Provider OB Triage Evaluation Note  Barbara Montgomery is a 20 y.o. female, G1P0, approximately 6-[redacted] weeks pregnant., who presents to the emergency department with complaints of abdominal pain, vomiting and lightheadedness. No vaginal bleeding.  Review of  Systems  Positive: Abdominal pain, vomiting, lightheadedness Negative: Fever, vaginal bleeding  Physical Exam  BP (!) 106/45 (BP Location: Right Arm)   Pulse 88   Temp (!) 97.5 F (36.4 C) (Oral)   Resp 16   SpO2 100%  General: Awake, no distress  HEENT: Atraumatic  Resp: Normal effort  Cardiac: Normal rate Abd: Nondistended, mild generalized tenderness MSK: Moves all extremities without difficulty Neuro: Speech clear  Medical Decision Making  Pt evaluated for pregnancy concern and is stable for transfer to MAU. Pt is in agreement with plan for transfer.  Patient's partner provided paperwork from health department that confirmed pregnancy  11:06 AM Discussed with MAU APP, Joni Reining, who accepts patient in transfer.  Clinical Impression   1. Abdominal pain during pregnancy in first trimester        Legrand Rams 09/01/20 1112    Mancel Bale, MD 09/01/20 2013

## 2020-09-01 NOTE — MAU Note (Signed)
Barbara Montgomery is a 20 y.o. at [redacted]w[redacted]d here in MAU reporting: has been having nausea and today she started vomiting. States 2 episodes of vomiting. Also feeling dizzy. Having upper abdominal pain intermittently.   Pt has pregnancy confirmation with her from [facility].  LMP: 07/18/20  Onset of complaint: ongoing but worse today  Pain score: 0/10  Vitals:   09/01/20 1046 09/01/20 1212  BP: (!) 106/45 (!) 100/52  Pulse: 88 77  Resp: 16 16  Temp: (!) 97.5 F (36.4 C) 98 F (36.7 C)  SpO2: 100% 100%     Lab orders placed from triage: UA

## 2020-09-02 LAB — CULTURE, OB URINE: Culture: NO GROWTH

## 2020-09-02 LAB — GC/CHLAMYDIA PROBE AMP (~~LOC~~) NOT AT ARMC
Chlamydia: NEGATIVE
Comment: NEGATIVE
Comment: NORMAL
Neisseria Gonorrhea: NEGATIVE

## 2020-11-06 LAB — OB RESULTS CONSOLE RUBELLA ANTIBODY, IGM: Rubella: IMMUNE

## 2020-11-09 LAB — OB RESULTS CONSOLE HEPATITIS B SURFACE ANTIGEN: Hepatitis B Surface Ag: NEGATIVE

## 2020-11-09 LAB — HEPATITIS C ANTIBODY: HCV Ab: NEGATIVE

## 2020-12-07 ENCOUNTER — Other Ambulatory Visit: Payer: Self-pay | Admitting: Family Medicine

## 2020-12-07 DIAGNOSIS — Z363 Encounter for antenatal screening for malformations: Secondary | ICD-10-CM

## 2020-12-17 ENCOUNTER — Ambulatory Visit: Payer: Medicaid Other | Attending: Obstetrics and Gynecology

## 2020-12-17 DIAGNOSIS — Z363 Encounter for antenatal screening for malformations: Secondary | ICD-10-CM

## 2021-01-22 ENCOUNTER — Other Ambulatory Visit: Payer: Self-pay | Admitting: Family Medicine

## 2021-01-22 DIAGNOSIS — Z3A27 27 weeks gestation of pregnancy: Secondary | ICD-10-CM

## 2021-01-22 DIAGNOSIS — Z362 Encounter for other antenatal screening follow-up: Secondary | ICD-10-CM

## 2021-01-22 DIAGNOSIS — O26843 Uterine size-date discrepancy, third trimester: Secondary | ICD-10-CM

## 2021-01-26 ENCOUNTER — Ambulatory Visit: Payer: Medicaid Other | Attending: Family Medicine

## 2021-01-26 ENCOUNTER — Other Ambulatory Visit: Payer: Self-pay

## 2021-01-26 DIAGNOSIS — Z3A27 27 weeks gestation of pregnancy: Secondary | ICD-10-CM | POA: Insufficient documentation

## 2021-01-26 DIAGNOSIS — Z362 Encounter for other antenatal screening follow-up: Secondary | ICD-10-CM | POA: Insufficient documentation

## 2021-01-26 DIAGNOSIS — O26843 Uterine size-date discrepancy, third trimester: Secondary | ICD-10-CM | POA: Insufficient documentation

## 2021-02-15 LAB — OB RESULTS CONSOLE HIV ANTIBODY (ROUTINE TESTING): HIV: NONREACTIVE

## 2021-02-15 LAB — OB RESULTS CONSOLE RPR: RPR: NONREACTIVE

## 2021-03-12 ENCOUNTER — Inpatient Hospital Stay (HOSPITAL_COMMUNITY)
Admission: AD | Admit: 2021-03-12 | Discharge: 2021-03-13 | Disposition: A | Discharge status: Home / Self Care | Payer: Medicaid Other | Attending: Obstetrics & Gynecology | Admitting: Obstetrics & Gynecology

## 2021-03-12 ENCOUNTER — Other Ambulatory Visit: Payer: Self-pay

## 2021-03-12 ENCOUNTER — Encounter (HOSPITAL_COMMUNITY): Payer: Self-pay | Admitting: Obstetrics & Gynecology

## 2021-03-12 DIAGNOSIS — O26893 Other specified pregnancy related conditions, third trimester: Secondary | ICD-10-CM | POA: Insufficient documentation

## 2021-03-12 DIAGNOSIS — L299 Pruritus, unspecified: Secondary | ICD-10-CM

## 2021-03-12 DIAGNOSIS — Z3A34 34 weeks gestation of pregnancy: Secondary | ICD-10-CM

## 2021-03-12 HISTORY — DX: Anemia, unspecified: D64.9

## 2021-03-12 NOTE — MAU Note (Signed)
PT SAYS WITH INTERPRETER- HAS ITCHING ALL OVER HER BODY X1 WEEK NO RASH PNC- HD CALLED ON Monday- TOLD TO COME HERE  NO MEDS OR CREAMS

## 2021-03-13 ENCOUNTER — Encounter (HOSPITAL_COMMUNITY): Payer: Self-pay | Admitting: Obstetrics & Gynecology

## 2021-03-13 DIAGNOSIS — O99713 Diseases of the skin and subcutaneous tissue complicating pregnancy, third trimester: Secondary | ICD-10-CM | POA: Diagnosis not present

## 2021-03-13 DIAGNOSIS — L299 Pruritus, unspecified: Secondary | ICD-10-CM

## 2021-03-13 DIAGNOSIS — Z3A34 34 weeks gestation of pregnancy: Secondary | ICD-10-CM | POA: Diagnosis not present

## 2021-03-13 DIAGNOSIS — O26893 Other specified pregnancy related conditions, third trimester: Secondary | ICD-10-CM | POA: Diagnosis not present

## 2021-03-13 LAB — COMPREHENSIVE METABOLIC PANEL
ALT: 11 U/L (ref 0–44)
AST: 20 U/L (ref 15–41)
Albumin: 2.5 g/dL — ABNORMAL LOW (ref 3.5–5.0)
Alkaline Phosphatase: 240 U/L — ABNORMAL HIGH (ref 38–126)
Anion gap: 7 (ref 5–15)
BUN: 5 mg/dL — ABNORMAL LOW (ref 6–20)
CO2: 20 mmol/L — ABNORMAL LOW (ref 22–32)
Calcium: 8.6 mg/dL — ABNORMAL LOW (ref 8.9–10.3)
Chloride: 107 mmol/L (ref 98–111)
Creatinine, Ser: 0.48 mg/dL (ref 0.44–1.00)
GFR, Estimated: >60 mL/min (ref >60)
Glucose, Bld: 95 mg/dL (ref 70–99)
Potassium: 3.6 mmol/L (ref 3.5–5.1)
Sodium: 134 mmol/L — ABNORMAL LOW (ref 135–145)
Total Bilirubin: 0.3 mg/dL (ref 0.3–1.2)
Total Protein: 5.6 g/dL — ABNORMAL LOW (ref 6.5–8.1)

## 2021-03-13 LAB — CBC
HCT: 32.1 % — ABNORMAL LOW (ref 36.0–46.0)
Hemoglobin: 9.6 g/dL — ABNORMAL LOW (ref 12.0–15.0)
MCH: 21.6 pg — ABNORMAL LOW (ref 26.0–34.0)
MCHC: 29.9 g/dL — ABNORMAL LOW (ref 30.0–36.0)
MCV: 72.3 fL — ABNORMAL LOW (ref 80.0–100.0)
Platelets: 368 10*3/uL (ref 150–400)
RBC: 4.44 MIL/uL (ref 3.87–5.11)
RDW: 17 % — ABNORMAL HIGH (ref 11.5–15.5)
WBC: 8.1 10*3/uL (ref 4.0–10.5)
nRBC: 0.4 % — ABNORMAL HIGH (ref 0.0–0.2)

## 2021-03-13 MED ORDER — HYDROXYZINE PAMOATE 25 MG PO CAPS: 25.0000 mg | ORAL_CAPSULE | Freq: Three times a day (TID) | ORAL | 0 refills | Status: DC | PRN

## 2021-03-13 MED ORDER — HYDROXYZINE HCL 25 MG PO TABS
25.0000 mg | ORAL_TABLET | Freq: Once | ORAL | Status: AC
  Administered 2021-03-13: 25 mg via ORAL
  Filled 2021-03-13: qty 1

## 2021-03-13 NOTE — MAU Provider Note (Signed)
Chief Complaint:  Pruritis   Event Date/Time   First Provider Initiated Contact with Patient 03/13/21 [id number]     HPI: Barbara Montgomery is a 20 y.o. G1P0 at [redacted]w[redacted]d who presents to maternity admissions reporting generalized itching for 1 week. She denies rash. Itching is every where but reports mostly on palms of hands. She has not tried anything to relieve itching. Reports she has stopped using all soaps, lotions, and creams without any relief. She is concerned as she has a friend "whose baby was delivered emergently for the same thing" and she just wants to make sure she doesn't have the same thing as her friend. She also report intermittent pain in her lower abdomen "for a while". She denies leaking fluid or vaginal bleeding. Endorses active fetal movement.   Pregnancy Course:   Past Medical History:  Diagnosis Date   Anemia    OB History  Gravida Para Term Preterm AB Living  1            SAB IAB Ectopic Multiple Live Births               # Outcome Date GA Lbr Len/2nd Weight Sex Delivery Anes PTL Lv  1 Current            Past Surgical History:  Procedure Laterality Date   APPENDECTOMY     History reviewed. No pertinent family history. Social History   Tobacco Use   Smoking status: Never   Smokeless tobacco: Never  Vaping Use   Vaping Use: Never used  Substance Use Topics   Alcohol use: Never   Drug use: Never   No Known Allergies No medications prior to admission.    I have reviewed patient's Past Medical Hx, Surgical Hx, Family Hx, Social Hx, medications and allergies.   ROS:  Review of Systems  Constitutional:        Generalized pruritis  HENT: Negative.    Respiratory: Negative.    Cardiovascular: Negative.   Gastrointestinal:  Positive for abdominal pain.  Genitourinary:  Negative for difficulty urinating.  Musculoskeletal: Negative.   Neurological: Negative.   Psychiatric/Behavioral: Negative.     Physical Exam  Patient Vitals for the past 24  hrs:  BP Temp Temp src Pulse Resp Height Weight  03/13/21 0448 (!) 96/55 -- -- 96 -- -- --  03/12/21 2232 (!) 115/51 97.8 F (36.6 C) Oral 98 20 4\' 10"  (1.473 m) 59.3 kg   Constitutional: well-developed, well-nourished female in no acute distress.  Cardiovascular: normal rate Respiratory: normal effort GI: abd soft, non-tender, gravid MS: extremities nontender, no edema, normal ROM Neurologic: alert and oriented x 4.  Pelvic: not indicated Cervix: closed/thick  FHT: Baseline 130 bpm, moderate variability, 15x15 accelerations present, no decelerations Toco: quiet   Labs: Results for orders placed or performed during the hospital encounter of 03/12/21 (from the past 24 hour(s))  CBC     Status: Abnormal   Collection Time: 03/13/21  3:01 AM  Result Value Ref Range   WBC 8.1 4.0 - 10.5 K/uL   RBC 4.44 3.87 - 5.11 MIL/uL   Hemoglobin 9.6 (L) 12.0 - 15.0 g/dL   HCT 03/15/21 (L) 43.3 - 29.5 %   MCV 72.3 (L) 80.0 - 100.0 fL   MCH 21.6 (L) 26.0 - 34.0 pg   MCHC 29.9 (L) 30.0 - 36.0 g/dL   RDW 18.8 (H) 41.6 - 60.6 %   Platelets 368 150 - 400 K/uL   nRBC 0.4 (H) 0.0 -  0.2 %  Comprehensive metabolic panel     Status: Abnormal   Collection Time: 03/13/21  3:01 AM  Result Value Ref Range   Sodium 134 (L) 135 - 145 mmol/L   Potassium 3.6 3.5 - 5.1 mmol/L   Chloride 107 98 - 111 mmol/L   CO2 20 (L) 22 - 32 mmol/L   Glucose, Bld 95 70 - 99 mg/dL   BUN 5 (L) 6 - 20 mg/dL   Creatinine, Ser 8.65 0.44 - 1.00 mg/dL   Calcium 8.6 (L) 8.9 - 10.3 mg/dL   Total Protein 5.6 (L) 6.5 - 8.1 g/dL   Albumin 2.5 (L) 3.5 - 5.0 g/dL   AST 20 15 - 41 U/L   ALT 11 0 - 44 U/L   Alkaline Phosphatase 240 (H) 38 - 126 U/L   Total Bilirubin 0.3 0.3 - 1.2 mg/dL   GFR, Estimated >78 >46 mL/min   Anion gap 7 5 - 15    Imaging:  No results found.  MAU Course: Orders Placed This Encounter  Procedures   CBC   Comprehensive metabolic panel   Bile acids, total   Discharge patient   Meds ordered this  encounter  Medications   hydrOXYzine (ATARAX/VISTARIL) tablet 25 mg   hydrOXYzine (VISTARIL) 25 MG capsule    Sig: Take 1 capsule (25 mg total) by mouth 3 (three) times daily as needed.    Dispense:  30 capsule    Refill:  0    Order Specific Question:   Supervising Provider    Answer:   Reva Bores [[id number]]    MDM: CBC, CMP unremarkable Bile acids pending Vistaril PO given. No scratching witnessed by CNM or RN NST reactive and reassuring Cervix closed/thick   Assessment: 1. [redacted] weeks gestation of pregnancy   2. Itching     Plan: Discharge home in stable condition.  Labor precautions and fetal kick counts reviewed Strict return precautions given [facility] Will contact patient regarding bile acids. Patient was informed that results take time to come back.      Follow-up Information     Department, [facility] Follow up.   Why: as scheduled. Return to MAU as needed Contact information: [address] [place] [address] [phone]                 Allergies as of 03/13/2021   No Known Allergies      Medication List     TAKE these medications    Doxylamine-Pyridoxine 10-10 MG Tbec Take 2 tabs at bedtime. If needed, add another tab in the morning. If needed, add another tab in the afternoon, up to 4 tabs/day.   famotidine 20 MG tablet Commonly known as: PEPCID Take 1 tablet (20 mg total) by mouth daily.   hydrOXYzine 25 MG capsule Commonly known as: Vistaril Take 1 capsule (25 mg total) by mouth 3 (three) times daily as needed.   prenatal multivitamin Tabs tablet Take 1 tablet by mouth daily at 12 noon.   promethazine 25 MG tablet Commonly known as: PHENERGAN Take 1 tablet (25 mg total) by mouth every 8 (eight) hours as needed for refractory nausea / vomiting.         Camelia Eng, CNM 03/13/2021 6:24 AM

## 2021-03-15 LAB — BILE ACIDS, TOTAL: Bile Acids Total: 8.6 umol/L (ref 0.0–10.0)

## 2021-03-29 LAB — OB RESULTS CONSOLE GBS: GBS: NEGATIVE

## 2021-04-01 ENCOUNTER — Other Ambulatory Visit: Payer: Self-pay | Admitting: Advanced Practice Midwife

## 2021-04-03 ENCOUNTER — Inpatient Hospital Stay (HOSPITAL_COMMUNITY): Payer: Medicaid Other

## 2021-04-03 ENCOUNTER — Inpatient Hospital Stay (HOSPITAL_COMMUNITY)
Admission: AD | Admit: 2021-04-03 | Discharge: 2021-04-06 | DRG: 805 | Disposition: A | Discharge status: Home / Self Care | Payer: Medicaid Other | Attending: Obstetrics & Gynecology | Admitting: Obstetrics & Gynecology

## 2021-04-03 ENCOUNTER — Encounter (HOSPITAL_COMMUNITY): Payer: Self-pay | Admitting: Family Medicine

## 2021-04-03 DIAGNOSIS — O9902 Anemia complicating childbirth: Secondary | ICD-10-CM | POA: Diagnosis present

## 2021-04-03 DIAGNOSIS — O2662 Liver and biliary tract disorders in childbirth: Secondary | ICD-10-CM | POA: Diagnosis present

## 2021-04-03 DIAGNOSIS — D509 Iron deficiency anemia, unspecified: Secondary | ICD-10-CM | POA: Diagnosis present

## 2021-04-03 DIAGNOSIS — O411230 Chorioamnionitis, third trimester, not applicable or unspecified: Secondary | ICD-10-CM | POA: Diagnosis present

## 2021-04-03 DIAGNOSIS — O26613 Liver and biliary tract disorders in pregnancy, third trimester: Secondary | ICD-10-CM | POA: Diagnosis present

## 2021-04-03 DIAGNOSIS — Z3A37 37 weeks gestation of pregnancy: Secondary | ICD-10-CM | POA: Diagnosis not present

## 2021-04-03 DIAGNOSIS — Z20822 Contact with and (suspected) exposure to covid-19: Secondary | ICD-10-CM | POA: Diagnosis present

## 2021-04-03 DIAGNOSIS — O411290 Chorioamnionitis, unspecified trimester, not applicable or unspecified: Secondary | ICD-10-CM

## 2021-04-03 DIAGNOSIS — K831 Obstruction of bile duct: Principal | ICD-10-CM | POA: Diagnosis present

## 2021-04-03 DIAGNOSIS — O26643 Intrahepatic cholestasis of pregnancy, third trimester: Secondary | ICD-10-CM | POA: Diagnosis present

## 2021-04-03 DIAGNOSIS — O0990 Supervision of high risk pregnancy, unspecified, unspecified trimester: Secondary | ICD-10-CM

## 2021-04-03 LAB — CBC
HCT: 32.2 % — ABNORMAL LOW (ref 36.0–46.0)
Hemoglobin: 9.7 g/dL — ABNORMAL LOW (ref 12.0–15.0)
MCH: 21.1 pg — ABNORMAL LOW (ref 26.0–34.0)
MCHC: 30.1 g/dL (ref 30.0–36.0)
MCV: 70.2 fL — ABNORMAL LOW (ref 80.0–100.0)
Platelets: 306 10*3/uL (ref 150–400)
RBC: 4.59 MIL/uL (ref 3.87–5.11)
RDW: 17.6 % — ABNORMAL HIGH (ref 11.5–15.5)
WBC: 6.9 10*3/uL (ref 4.0–10.5)
nRBC: 0.3 % — ABNORMAL HIGH (ref 0.0–0.2)

## 2021-04-03 LAB — RESP PANEL BY RT-PCR (FLU A&B, COVID) ARPGX2
Influenza A by PCR: NEGATIVE
Influenza B by PCR: NEGATIVE
SARS Coronavirus 2 by RT PCR: NEGATIVE

## 2021-04-03 LAB — TYPE AND SCREEN
ABO/RH(D): O POS
Antibody Screen: NEGATIVE

## 2021-04-03 MED ORDER — LACTATED RINGERS IV SOLN
500.0000 mL | INTRAVENOUS | Status: DC | PRN
Start: 2021-04-03 — End: 2021-04-04
  Administered 2021-04-03 – 2021-04-04 (×2): 500 mL via INTRAVENOUS

## 2021-04-03 MED ORDER — HYDROXYZINE HCL 10 MG PO TABS
10.0000 mg | ORAL_TABLET | Freq: Three times a day (TID) | ORAL | Status: DC | PRN
  Filled 2021-04-03: qty 1

## 2021-04-03 MED ORDER — LACTATED RINGERS IV SOLN: INTRAVENOUS | Status: DC

## 2021-04-03 MED ORDER — OXYTOCIN-SODIUM CHLORIDE 30-0.9 UT/500ML-% IV SOLN: 2.5000 [IU]/h | INTRAVENOUS | Status: DC

## 2021-04-03 MED ORDER — MISOPROSTOL 50MCG HALF TABLET
50.0000 ug | ORAL_TABLET | ORAL | Status: DC | PRN
  Administered 2021-04-03: 50 ug via BUCCAL
  Filled 2021-04-03: qty 1

## 2021-04-03 MED ORDER — OXYTOCIN BOLUS FROM INFUSION
333.0000 mL | Freq: Once | INTRAVENOUS | Status: AC
  Administered 2021-04-04: 333 mL via INTRAVENOUS

## 2021-04-03 MED ORDER — LIDOCAINE HCL (PF) 1 % IJ SOLN: 30.0000 mL | INTRAMUSCULAR | Status: DC | PRN

## 2021-04-03 MED ORDER — TERBUTALINE SULFATE 1 MG/ML IJ SOLN
0.2500 mg | Freq: Once | INTRAMUSCULAR | Status: AC | PRN
  Administered 2021-04-03: 0.25 mg via SUBCUTANEOUS

## 2021-04-03 MED ORDER — FENTANYL CITRATE (PF) 100 MCG/2ML IJ SOLN
100.0000 ug | INTRAMUSCULAR | Status: DC | PRN
  Administered 2021-04-03: 100 ug via INTRAVENOUS
  Filled 2021-04-03: qty 2

## 2021-04-03 MED ORDER — SOD CITRATE-CITRIC ACID 500-334 MG/5ML PO SOLN: 30.0000 mL | ORAL | Status: DC | PRN

## 2021-04-03 MED ORDER — OXYCODONE-ACETAMINOPHEN 5-325 MG PO TABS: 2.0000 | ORAL_TABLET | ORAL | Status: DC | PRN

## 2021-04-03 MED ORDER — ACETAMINOPHEN 325 MG PO TABS: 650.0000 mg | ORAL_TABLET | ORAL | Status: DC | PRN

## 2021-04-03 MED ORDER — OXYCODONE-ACETAMINOPHEN 5-325 MG PO TABS: 1.0000 | ORAL_TABLET | ORAL | Status: DC | PRN

## 2021-04-03 MED ORDER — ONDANSETRON HCL 4 MG/2ML IJ SOLN
4.0000 mg | Freq: Four times a day (QID) | INTRAMUSCULAR | Status: DC | PRN
  Administered 2021-04-04: 4 mg via INTRAVENOUS
  Filled 2021-04-03: qty 2

## 2021-04-03 MED ORDER — MISOPROSTOL 25 MCG QUARTER TABLET: 25.0000 ug | ORAL_TABLET | ORAL | Status: DC | PRN

## 2021-04-03 MED ORDER — TERBUTALINE SULFATE 1 MG/ML IJ SOLN
0.2500 mg | Freq: Once | INTRAMUSCULAR | Status: DC | PRN
  Filled 2021-04-03: qty 1

## 2021-04-03 NOTE — Progress Notes (Signed)
Called by RN due to tachysystole with increase in fetal HR baseline from 125>150 with minimal variability.   Evaluated patient at bedside. She is very uncomfortable leaning over the edge of the bed. Contractions about every minute with high resting baseline, abdomen tight. FB in place and cytotec given about 2 hours ago. Prior to recurrent contractions, she was tolerating the FB without difficulty.   Given terb x1. Contraction pattern improving, FHR reassuring with moderate variability.   Cont to monitor.   Allayne Stack, DO

## 2021-04-03 NOTE — H&P (Signed)
OBSTETRIC ADMISSION HISTORY AND PHYSICAL  Barbara Montgomery is a 20 y.o. female G1P0 with IUP at [redacted]w[redacted]d by LMP presenting for IOL due to cholestasis. She reports +FMs, no LOF, no VB, no contractions, no blurry vision, headaches, peripheral edema, or RUQ pain.  She plans on breast and formula feeding. She is undecided about what she would like to use for birth control postpartum.   She received her prenatal care at [facility].  Dating: By LMP --->  Estimated Date of Delivery: 04/24/21  Sono:   @[redacted]w[redacted]d , CWD, normal anatomy, cephalic presentation, posterior placental lie, 1298 g, 89% EFW  Prenatal History/Complications:  Cholestasis of pregnancy  Iron deficiency anemia (last 9.9 on 11/14 prior to admission)   Past Medical History: Past Medical History:  Diagnosis Date   Anemia     Past Surgical History: Past Surgical History:  Procedure Laterality Date   APPENDECTOMY      Obstetrical History: OB History     Gravida  1   Para      Term      Preterm      AB      Living         SAB      IAB      Ectopic      Multiple      Live Births              Social History Social History   Socioeconomic History   Marital status: Single    Spouse name: Not on file   Number of children: Not on file   Years of education: Not on file   Highest education level: Not on file  Occupational History   Not on file  Tobacco Use   Smoking status: Never   Smokeless tobacco: Never  Vaping Use   Vaping Use: Never used  Substance and Sexual Activity   Alcohol use: Never   Drug use: Never   Sexual activity: Not Currently  Other Topics Concern   Not on file  Social History Narrative   Not on file   Social Determinants of Health   Financial Resource Strain: Not on file  Food Insecurity: Not on file  Transportation Needs: Not on file  Physical Activity: Not on file  Stress: Not on file  Social Connections: Not on file    Family History: History reviewed. No  pertinent family history.  Allergies: No Known Allergies  Medications Prior to Admission  Medication Sig Dispense Refill Last Dose   Doxylamine-Pyridoxine 10-10 MG TBEC Take 2 tabs at bedtime. If needed, add another tab in the morning. If needed, add another tab in the afternoon, up to 4 tabs/day. 60 tablet 0    famotidine (PEPCID) 20 MG tablet Take 1 tablet (20 mg total) by mouth daily. 30 tablet 1    hydrOXYzine (VISTARIL) 25 MG capsule Take 1 capsule (25 mg total) by mouth 3 (three) times daily as needed. 30 capsule 0    Prenatal Vit-Fe Fumarate-FA (PRENATAL MULTIVITAMIN) TABS tablet Take 1 tablet by mouth daily at 12 noon.      promethazine (PHENERGAN) 25 MG tablet Take 1 tablet (25 mg total) by mouth every 8 (eight) hours as needed for refractory nausea / vomiting. 15 tablet 0      Review of Systems  All systems reviewed and negative except as stated in HPI  Blood pressure 111/67, pulse 88, temperature 98.9 F (37.2 C), temperature source Oral, resp. rate 17, height 4\' 10"  (1.473 m),  weight 61.2 kg, last menstrual period 07/18/2020.  General appearance: alert, cooperative, and no distress Lungs: normal work of breathing on room air  Heart: normal rate, warm and well perfused  Abdomen: soft, non-tender, gravid  Extremities: no LE edema or calf tenderness to palpation   Presentation: Cephalic Fetal monitoring: Baseline 125 bpm, moderate variability, + accels, no decels  Uterine activity: Intermittent contractions  Dilation: 2 Effacement (%): 50 Exam by:: Lillia Pauls, MD   Prenatal labs: (Records reviewed from media tab - [facility]) ABO, Rh: --/--/O POS (11/19 1859) Antibody: NEG (11/19 1859) Rubella: Immune (06/24 0000) RPR: Nonreactive (10/03 0000) HBsAg: Negative (06/27 0000) HIV: Non-reactive (10/03 0000) GBS: Negative/-- (11/14 0000) 1 hr Glucola - 125 (normal) Genetic screening - Quad screen, CF screen, Hgb electrophoresis all negative  Anatomy US normal   Prenatal  Transfer Tool  Maternal Diabetes: No Genetic Screening: Normal Maternal Ultrasounds/Referrals: Normal Fetal Ultrasounds or other Referrals:  None Maternal Substance Abuse:  No Significant Maternal Medications:  None Significant Maternal Lab Results: Group B Strep negative  Results for orders placed or performed during the hospital encounter of 04/03/21 (from the past 24 hour(s))  CBC   Collection Time: 04/03/21  6:56 PM  Result Value Ref Range   WBC 6.9 4.0 - 10.5 K/uL   RBC 4.59 3.87 - 5.11 MIL/uL   Hemoglobin 9.7 (L) 12.0 - 15.0 g/dL   HCT 44.9 (L) 67.5 - 91.6 %   MCV 70.2 (L) 80.0 - 100.0 fL   MCH 21.1 (L) 26.0 - 34.0 pg   MCHC 30.1 30.0 - 36.0 g/dL   RDW 38.4 (H) 66.5 - 99.3 %   Platelets 306 150 - 400 K/uL   nRBC 0.3 (H) 0.0 - 0.2 %  Type and screen   Collection Time: 04/03/21  6:59 PM  Result Value Ref Range   ABO/RH(D) O POS    Antibody Screen NEG    Sample Expiration      04/06/2021,2359 Performed at [facility], [address] [place], [place] [address]   Resp Panel by RT-PCR (Flu A&B, Covid) Nasopharyngeal Swab   Collection Time: 04/03/21  7:11 PM   Specimen: Nasopharyngeal Swab; Nasopharyngeal(NP) swabs in vial transport medium  Result Value Ref Range   SARS Coronavirus 2 by RT PCR NEGATIVE NEGATIVE   Influenza A by PCR NEGATIVE NEGATIVE   Influenza B by PCR NEGATIVE NEGATIVE    Patient Active Problem List   Diagnosis Date Noted   Cholestasis during pregnancy in third trimester 04/03/2021    Assessment/Plan:  Barbara Montgomery is a 20 y.o. G1P0 at [redacted]w[redacted]d here for IOL due to cholestasis.   #Labor: Foley balloon placed upon admission without issue. Mom and baby tolerated this well. Will give dose of buccal Cytotec and reassess in 4 hours.  #Pain: PRN; planning for epidural when ready #FWB: Cat 1 #ID:  GBS negative  #MOF: Breast and formula  #MOC: Undecided   #Cholestasis: Stable. Taking Hydroxyzine PRN for itching. Ordered. Will  continue to monitor.   #IDA: Last Hgb 9.9 on 11/14 per chart review. Will follow up admission Hgb.   Worthy Rancher, MD  04/03/2021, 8:47 PM

## 2021-04-04 ENCOUNTER — Other Ambulatory Visit: Payer: Self-pay

## 2021-04-04 ENCOUNTER — Inpatient Hospital Stay (HOSPITAL_COMMUNITY): Payer: Medicaid Other | Admitting: Anesthesiology

## 2021-04-04 ENCOUNTER — Encounter (HOSPITAL_COMMUNITY): Payer: Self-pay | Admitting: Family Medicine

## 2021-04-04 DIAGNOSIS — Z3A37 37 weeks gestation of pregnancy: Secondary | ICD-10-CM

## 2021-04-04 DIAGNOSIS — O411230 Chorioamnionitis, third trimester, not applicable or unspecified: Secondary | ICD-10-CM

## 2021-04-04 DIAGNOSIS — O2662 Liver and biliary tract disorders in childbirth: Secondary | ICD-10-CM

## 2021-04-04 LAB — URINALYSIS, ROUTINE W REFLEX MICROSCOPIC
Bilirubin Urine: NEGATIVE
Glucose, UA: NEGATIVE mg/dL
Ketones, ur: NEGATIVE mg/dL
Nitrite: NEGATIVE
Protein, ur: 30 mg/dL — AB
RBC / HPF: >50 RBC/hpf — ABNORMAL HIGH (ref 0–5)
Specific Gravity, Urine: 1.016 (ref 1.005–1.030)
pH: 7 (ref 5.0–8.0)

## 2021-04-04 LAB — RPR: RPR Ser Ql: NONREACTIVE

## 2021-04-04 MED ORDER — LACTATED RINGERS IV SOLN: 500.0000 mL | Freq: Once | INTRAVENOUS | Status: DC

## 2021-04-04 MED ORDER — TETANUS-DIPHTH-ACELL PERTUSSIS 5-2.5-18.5 LF-MCG/0.5 IM SUSY: 0.5000 mL | PREFILLED_SYRINGE | Freq: Once | INTRAMUSCULAR | Status: DC

## 2021-04-04 MED ORDER — PHENYLEPHRINE 40 MCG/ML (10ML) SYRINGE FOR IV PUSH (FOR BLOOD PRESSURE SUPPORT): 80.0000 ug | PREFILLED_SYRINGE | INTRAVENOUS | Status: DC | PRN

## 2021-04-04 MED ORDER — ACETAMINOPHEN 500 MG PO TABS
1000.0000 mg | ORAL_TABLET | Freq: Four times a day (QID) | ORAL | Status: DC | PRN
  Filled 2021-04-04: qty 2

## 2021-04-04 MED ORDER — OXYCODONE HCL 5 MG PO TABS
5.0000 mg | ORAL_TABLET | ORAL | Status: DC | PRN
  Filled 2021-04-04: qty 1

## 2021-04-04 MED ORDER — GENTAMICIN SULFATE 40 MG/ML IJ SOLN
5.0000 mg/kg | Freq: Once | INTRAVENOUS | Status: DC
  Filled 2021-04-04: qty 7.75

## 2021-04-04 MED ORDER — SODIUM CHLORIDE 0.9 % IV SOLN
2.0000 g | Freq: Four times a day (QID) | INTRAVENOUS | Status: AC
  Administered 2021-04-04 (×2): 2 g via INTRAVENOUS
  Filled 2021-04-04 (×2): qty 2000

## 2021-04-04 MED ORDER — ACETAMINOPHEN 10 MG/ML IV SOLN
1000.0000 mg | Freq: Four times a day (QID) | INTRAVENOUS | Status: DC
  Administered 2021-04-04: 1000 mg via INTRAVENOUS
  Filled 2021-04-04 (×2): qty 100

## 2021-04-04 MED ORDER — DIPHENHYDRAMINE HCL 25 MG PO CAPS: 25.0000 mg | ORAL_CAPSULE | Freq: Four times a day (QID) | ORAL | Status: DC | PRN

## 2021-04-04 MED ORDER — COCONUT OIL OIL
1.0000 "application " | TOPICAL_OIL | Status: DC | PRN
  Administered 2021-04-05: 1 via TOPICAL

## 2021-04-04 MED ORDER — OXYTOCIN-SODIUM CHLORIDE 30-0.9 UT/500ML-% IV SOLN
1.0000 m[IU]/min | INTRAVENOUS | Status: DC
  Administered 2021-04-04: 2 m[IU]/min via INTRAVENOUS
  Administered 2021-04-04: 6 m[IU]/min via INTRAVENOUS
  Filled 2021-04-04: qty 500

## 2021-04-04 MED ORDER — EPHEDRINE 5 MG/ML INJ: 10.0000 mg | INTRAVENOUS | Status: DC | PRN

## 2021-04-04 MED ORDER — DEXTROSE 5 % IV SOLN
5.0000 mg/kg | INTRAVENOUS | Status: DC
Start: 2021-04-04 — End: 2021-04-04
  Administered 2021-04-04: 310 mg via INTRAVENOUS
  Filled 2021-04-04: qty 7.75

## 2021-04-04 MED ORDER — IBUPROFEN 600 MG PO TABS
600.0000 mg | ORAL_TABLET | Freq: Four times a day (QID) | ORAL | Status: DC
  Administered 2021-04-04 – 2021-04-06 (×5): 600 mg via ORAL
  Filled 2021-04-04 (×6): qty 1

## 2021-04-04 MED ORDER — ZOLPIDEM TARTRATE 5 MG PO TABS: 5.0000 mg | ORAL_TABLET | Freq: Every evening | ORAL | Status: DC | PRN

## 2021-04-04 MED ORDER — PHENYLEPHRINE 40 MCG/ML (10ML) SYRINGE FOR IV PUSH (FOR BLOOD PRESSURE SUPPORT)
80.0000 ug | PREFILLED_SYRINGE | INTRAVENOUS | Status: DC | PRN
  Filled 2021-04-04: qty 10

## 2021-04-04 MED ORDER — DIPHENHYDRAMINE HCL 50 MG/ML IJ SOLN: 12.5000 mg | INTRAMUSCULAR | Status: DC | PRN

## 2021-04-04 MED ORDER — FENTANYL-BUPIVACAINE-NACL 0.5-0.125-0.9 MG/250ML-% EP SOLN
12.0000 mL/h | EPIDURAL | Status: DC | PRN
  Administered 2021-04-04: 12 mL/h via EPIDURAL
  Filled 2021-04-04: qty 250

## 2021-04-04 MED ORDER — LIDOCAINE-EPINEPHRINE (PF) 2 %-1:200000 IJ SOLN
INTRAMUSCULAR | Status: DC | PRN
Start: 2021-04-04 — End: 2021-04-04
  Administered 2021-04-04: 5 mL via EPIDURAL

## 2021-04-04 MED ORDER — BENZOCAINE-MENTHOL 20-0.5 % EX AERO
1.0000 "application " | INHALATION_SPRAY | CUTANEOUS | Status: DC | PRN
  Administered 2021-04-05: 1 via TOPICAL
  Filled 2021-04-04: qty 56

## 2021-04-04 MED ORDER — SIMETHICONE 80 MG PO CHEW
80.0000 mg | CHEWABLE_TABLET | ORAL | Status: DC | PRN
  Filled 2021-04-04: qty 1

## 2021-04-04 MED ORDER — PRENATAL MULTIVITAMIN CH
1.0000 | ORAL_TABLET | Freq: Every day | ORAL | Status: DC
  Administered 2021-04-04 – 2021-04-05 (×2): 1 via ORAL
  Filled 2021-04-04 (×2): qty 1

## 2021-04-04 MED ORDER — WITCH HAZEL-GLYCERIN EX PADS
1.0000 "application " | MEDICATED_PAD | CUTANEOUS | Status: DC | PRN
  Administered 2021-04-05: 1 via TOPICAL

## 2021-04-04 MED ORDER — DIBUCAINE (PERIANAL) 1 % EX OINT: 1.0000 "application " | TOPICAL_OINTMENT | CUTANEOUS | Status: DC | PRN

## 2021-04-04 MED ORDER — TERBUTALINE SULFATE 1 MG/ML IJ SOLN: 0.2500 mg | Freq: Once | INTRAMUSCULAR | Status: DC | PRN

## 2021-04-04 MED ORDER — ACETAMINOPHEN 325 MG PO TABS
650.0000 mg | ORAL_TABLET | ORAL | Status: DC | PRN
  Administered 2021-04-05: 650 mg via ORAL
  Filled 2021-04-04: qty 2

## 2021-04-04 MED ORDER — ONDANSETRON HCL 4 MG/2ML IJ SOLN: 4.0000 mg | INTRAMUSCULAR | Status: DC | PRN

## 2021-04-04 MED ORDER — ONDANSETRON HCL 4 MG PO TABS: 4.0000 mg | ORAL_TABLET | ORAL | Status: DC | PRN

## 2021-04-04 MED ORDER — SENNOSIDES-DOCUSATE SODIUM 8.6-50 MG PO TABS
2.0000 | ORAL_TABLET | Freq: Every day | ORAL | Status: DC
  Administered 2021-04-05: 2 via ORAL
  Filled 2021-04-04: qty 2

## 2021-04-04 NOTE — Discharge Summary (Signed)
Postpartum Discharge Summary    Patient Name: Barbara Montgomery DOB: Jun 16, 2000 MRN: [id number]  Date of admission: 04/03/2021 Delivery date:04/04/2021  Delivering provider: WHITE, Ubaldo Glassing D  Date of discharge: 04/06/2021  Admitting diagnosis: Cholestasis during pregnancy in third trimester [O26.613, K83.1] Intrauterine pregnancy: [redacted]w[redacted]d     Secondary diagnosis:  Principal Problem:   Cholestasis during pregnancy in third trimester Active Problems:   Supervision of high risk pregnancy, antepartum   Vaginal delivery   Chorioamnionitis  Additional problems: None    Discharge diagnosis: Term Pregnancy Delivered                                              Post partum procedures: None Augmentation: Pitocin, Cytotec, and IP Foley Complications: Intrauterine Inflammation or infection (Chorioamniotis)  Hospital course: Induction of Labor With Vaginal Delivery   20 y.o. yo G1P0 at [redacted]w[redacted]d was admitted to the hospital 04/03/2021 for induction of labor.  Indication for induction: Cholestasis of pregnancy.  Patient had an uncomplicated labor course as follows: Membrane Rupture Time/Date: 7:30 AM ,04/04/2021   Delivery Method:Vaginal, Spontaneous  Episiotomy: None  Lacerations:  1st degree  Details of delivery can be found in separate delivery note.  Patient had a routine postpartum course. Patient is discharged home 04/06/21.  Newborn Data: Birth date:04/04/2021  Birth time:8:29 AM  Gender:Female  Living status:Living  Apgars:8 ,9  Weight:3005 g   Magnesium Sulfate received: No BMZ received: No Rhophylac:N/A MMR:N/A T-DaP:Given prenatally Flu: No Transfusion:No  Physical exam  Vitals:   04/05/21 0555 04/05/21 1355 04/05/21 1948 04/06/21 0535  BP: (!) 88/47 108/70 (!) 105/48 105/66  Pulse: 74 (!) 103 74 83  Resp: $Remo'18 18 16 18  'NiFvq$ Temp: 97.8 F (36.6 C) 98.4 F (36.9 C) 98.2 F (36.8 C) 98 F (36.7 C)  TempSrc: Oral Oral Oral Oral  SpO2: 99% 99% 100%   Weight:       Height:       General: alert Lochia: appropriate Uterine Fundus: firm Incision: N/A DVT Evaluation: No evidence of DVT seen on physical exam. Labs: Lab Results  Component Value Date   WBC 6.9 04/03/2021   HGB 9.7 (L) 04/03/2021   HCT 32.2 (L) 04/03/2021   MCV 70.2 (L) 04/03/2021   PLT 306 04/03/2021   CMP Latest Ref Rng & Units 03/13/2021  Glucose 70 - 99 mg/dL 95  BUN 6 - 20 mg/dL 5(L)  Creatinine 0.44 - 1.00 mg/dL 0.48  Sodium 135 - 145 mmol/L 134(L)  Potassium 3.5 - 5.1 mmol/L 3.6  Chloride 98 - 111 mmol/L 107  CO2 22 - 32 mmol/L 20(L)  Calcium 8.9 - 10.3 mg/dL 8.6(L)  Total Protein 6.5 - 8.1 g/dL 5.6(L)  Total Bilirubin 0.3 - 1.2 mg/dL 0.3  Alkaline Phos 38 - 126 U/L 240(H)  AST 15 - 41 U/L 20  ALT 0 - 44 U/L 11   Edinburgh Score: Edinburgh Postnatal Depression Scale Screening Tool 04/05/2021  I have been able to laugh and see the funny side of things. 1  I have looked forward with enjoyment to things. 0  I have blamed myself unnecessarily when things went wrong. 0  I have been anxious or worried for no good reason. 2  I have felt scared or panicky for no good reason. 1  Things have been getting on top of me. 1  I have been so unhappy  that I have had difficulty sleeping. 0  I have felt sad or miserable. 1  I have been so unhappy that I have been crying. 0  The thought of harming myself has occurred to me. 0  Edinburgh Postnatal Depression Scale Total 6     After visit meds:  Allergies as of 04/06/2021   No Known Allergies      Medication List     STOP taking these medications    Doxylamine-Pyridoxine 10-10 MG Tbec   famotidine 20 MG tablet Commonly known as: PEPCID   hydrOXYzine 25 MG capsule Commonly known as: Vistaril   promethazine 25 MG tablet Commonly known as: PHENERGAN       TAKE these medications    acetaminophen 325 MG tablet Commonly known as: Tylenol Take 2 tablets (650 mg total) by mouth every 4 (four) hours as needed (for  pain scale < 4).   ferrous sulfate 325 (65 FE) MG tablet Take 1 tablet (325 mg total) by mouth every other day.   ibuprofen 600 MG tablet Commonly known as: ADVIL Take 1 tablet (600 mg total) by mouth every 6 (six) hours.   prenatal multivitamin Tabs tablet Take 1 tablet by mouth daily at 12 noon.         Discharge home in stable condition Infant Feeding: Breast Infant Disposition:home with mother Discharge instruction: per After Visit Summary and Postpartum booklet. Activity: Advance as tolerated. Pelvic rest for 6 weeks.  Diet: routine diet Future Appointments:No future appointments. Follow up Visit: Contraception: plan for further discussion at [facility] follow up visit   Patient to follow up at [facility], patient made aware to call to schedule appt  Renard Matter, MD, MPH OB Fellow, Faculty Practice

## 2021-04-04 NOTE — Lactation Note (Signed)
This note was copied from a baby's chart. Lactation Consultation Note  Patient Name: Barbara Montgomery [id number] Date: 04/04/2021 Reason for consult: [name] Initial assessment Age:21 hours  P1, Video interpreter used for Spanish. Baby latched with ease, intermittent swallows. Lactation to follow up on [name].  [name] asked if they needed to pump, LC stated no, not at this time but will be discussed on [name].    LATCH Score Latch: Grasps breast easily, tongue down, lips flanged, rhythmical sucking.  Audible Swallowing: A few with stimulation  Type of Nipple: Everted at rest and after stimulation  Comfort (Breast/Nipple): Soft / non-tender  Hold (Positioning): Assistance needed to correctly position infant at breast and maintain latch.  LATCH Score: 8    Interventions Interventions: Assisted with latch;Skin to skin;Education  Discharge    Consult Status Consult Status: Follow-up from [name]    Dahlia Byes [facility] 04/04/2021, 9:41 AM

## 2021-04-04 NOTE — Anesthesia Procedure Notes (Signed)
Epidural Patient location during procedure: OB Start time: 04/04/2021 2:45 AM End time: 04/04/2021 2:55 AM  Staffing Anesthesiologist: Elmer Picker, MD Performed: anesthesiologist   Preanesthetic Checklist Completed: patient identified, IV checked, risks and benefits discussed, monitors and equipment checked, pre-op evaluation and timeout performed  Epidural Patient position: sitting Prep: DuraPrep and site prepped and draped Patient monitoring: continuous pulse ox, blood pressure, heart rate and cardiac monitor Approach: midline Location: L3-L4 Injection technique: LOR air  Needle:  Needle type: Tuohy  Needle gauge: 17 G Needle length: 9 cm Needle insertion depth: 5 cm Catheter type: closed end flexible Catheter size: 19 Gauge Catheter at skin depth: 10 cm Test dose: negative  Assessment Sensory level: T8 Events: blood not aspirated, injection not painful, no injection resistance, no paresthesia and negative IV test  Additional Notes Patient identified. Risks/Benefits/Options discussed with patient including but not limited to bleeding, infection, nerve damage, paralysis, failed block, incomplete pain control, headache, blood pressure changes, nausea, vomiting, reactions to medication both or allergic, itching and postpartum back pain. Confirmed with bedside nurse the patient's most recent platelet count. Confirmed with patient that they are not currently taking any anticoagulation, have any bleeding history or any family history of bleeding disorders. Patient expressed understanding and wished to proceed. All questions were answered. Sterile technique was used throughout the entire procedure. Please see nursing notes for vital signs. Test dose was given through epidural catheter and negative prior to continuing to dose epidural or start infusion. Warning signs of high block given to the patient including shortness of breath, tingling/numbness in hands, complete motor block,  or any concerning symptoms with instructions to call for help. Patient was given instructions on fall risk and not to get out of bed. All questions and concerns addressed with instructions to call with any issues or inadequate analgesia.  Reason for block:procedure for pain

## 2021-04-04 NOTE — Progress Notes (Addendum)
Labor Progress Note Barbara Montgomery is a 20 y.o. G1P0 at [redacted]w[redacted]d presented for IOL due to cholestasis.   FB now out per RN. Feeling contractions frequently.   O:  BP 107/73   Pulse 83   Temp 98 F (36.7 C) (Oral)   Resp 16   Ht 4\' 10"  (1.473 m)   Wt 61.2 kg   LMP 07/18/2020   SpO2 99%   BMI 28.22 kg/m  EFM: 140/mod/15x15/none  CVE: Dilation: 4 Effacement (%): 70 Station: -3 Presentation: Vertex Exam by:: [email or web address], RN   [name]: 20 y.o. G1P0 [redacted]w[redacted]d  #Labor: Progressing well now s/p FB placement. Currently having contractions every 2-3 min, will monitor and if spaces out plan to start pit 2x2. Encouraging continued position changes and staying mobile as she can. On next check, will consider AROM if station improved.  #Pain: PRN #FWB: Cat 1  #GBS negative   [redacted]w[redacted]d, DO 3:18 AM

## 2021-04-04 NOTE — Progress Notes (Signed)
Labor Progress Note Barbara Montgomery is a 20 y.o. G1P0 at [redacted]w[redacted]d presented for IOL due to cholestasis.   In person spanish interpreter present.   S: Feeling pressure and "like I have to pee". Denies any recent dysuria, SOB, nasal congestion, or cough.   O:  BP (!) 95/52   Pulse (!) 108   Temp (!) 101.2 F (38.4 C) (Oral)   Resp 18   Ht 4\' 10"  (1.473 m)   Wt 61.2 kg   LMP 07/18/2020   SpO2 97%   BMI 28.22 kg/m  EFM: 150/mod/none/occasional variables/early  CVE: Dilation: 10 Dilation Complete Date: 04/04/21 Dilation Complete Time: 0744 Effacement (%): 100 Station: 0 Presentation: Vertex Exam by:: Dr. [email or web address]   [name]: 20 y.o. G1P0 [redacted]w[redacted]d  #Labor: Initially checked for AROM, however found to be complete and ruptured. Started with some practice pushes and did well, plan for vaginal delivery shortly.  #Pain: Epidural in place  #FWB: Cat II  #GBS negative  #Intrapartum fever: Temp 101.51F at 0615 with developing fetal tachycardia. Not ruptured at that time, however will presumably treat for Triple I with amp/gent, one dose of each already given. Tylenol given. U/A collected. Recent flu/covid negative yesterday evening.   [redacted]w[redacted]d, DO 8:15 AM

## 2021-04-04 NOTE — Anesthesia Postprocedure Evaluation (Signed)
Anesthesia Post Note  Patient: Barbara Montgomery  Procedure(s) Performed: AN AD HOC LABOR EPIDURAL     Patient location during evaluation: Mother Baby Anesthesia Type: Epidural Level of consciousness: awake and alert Pain management: pain level controlled Respiratory status: spontaneous breathing Cardiovascular status: stable Postop Assessment: no headache, adequate PO intake, no backache, patient able to bend at knees, able to ambulate, epidural receding and no apparent nausea or vomiting Anesthetic complications: no   No notable events documented.  Last Vitals:  Vitals:   04/04/21 1155 04/04/21 1630  BP: 106/69 (!) 95/59  Pulse: (!) 101 79  Resp: 17 20  Temp: 36.8 C 36.9 C  SpO2:      Last Pain:  Vitals:   04/04/21 1841  TempSrc:   PainSc: 0-No pain   Pain Goal:                   [name]

## 2021-04-04 NOTE — Anesthesia Preprocedure Evaluation (Signed)
Anesthesia Evaluation  Patient identified by MRN, date of birth, ID band Patient awake    Reviewed: Allergy & Precautions, NPO status , Patient's Chart, lab work & pertinent test results  Airway Mallampati: II  TM Distance: >3 FB Neck ROM: Full    Dental no notable dental hx.    Pulmonary neg pulmonary ROS,    Pulmonary exam normal breath sounds clear to auscultation       Cardiovascular negative cardio ROS Normal cardiovascular exam Rhythm:Regular Rate:Normal     Neuro/Psych negative neurological ROS  negative psych ROS   GI/Hepatic negative GI ROS, Neg liver ROS,   Endo/Other  negative endocrine ROS  Renal/GU negative Renal ROS  negative genitourinary   Musculoskeletal negative musculoskeletal ROS (+)   Abdominal   Peds  Hematology  (+) Blood dyscrasia (Hgb 9.7), anemia ,   Anesthesia Other Findings IOL for cholestasis  Reproductive/Obstetrics (+) Pregnancy                             Anesthesia Physical Anesthesia Plan  ASA: 2  Anesthesia Plan: Epidural   Post-op Pain Management:    Induction:   PONV Risk Score and Plan: Treatment may vary due to age or medical condition  Airway Management Planned: Natural Airway  Additional Equipment:   Intra-op Plan:   Post-operative Plan:   Informed Consent: I have reviewed the patients History and Physical, chart, labs and discussed the procedure including the risks, benefits and alternatives for the proposed anesthesia with the patient or authorized representative who has indicated his/her understanding and acceptance.       Plan Discussed with: Anesthesiologist  Anesthesia Plan Comments: (Patient identified. Risks, benefits, options discussed with patient including but not limited to bleeding, infection, nerve damage, paralysis, failed block, incomplete pain control, headache, blood pressure changes, nausea, vomiting, reactions  to medication, itching, and post partum back pain. Confirmed with bedside nurse the patient's most recent platelet count. Confirmed with the patient that they are not taking any anticoagulation, have any bleeding history or any family history of bleeding disorders. Patient expressed understanding and wishes to proceed. All questions were answered. )        Anesthesia Quick Evaluation

## 2021-04-05 MED ORDER — FERROUS SULFATE 325 (65 FE) MG PO TABS: 325.0000 mg | ORAL_TABLET | ORAL | Status: DC

## 2021-04-05 NOTE — Lactation Note (Signed)
This note was copied from a baby's chart. Lactation Consultation Note  Patient Name: Barbara Montgomery BFXOV'A Date: 04/05/2021 Reason for consult: Follow-up assessment;Primapara;Early term 37-38.6wks;1st time breastfeeding Age:21 hours   LC Follow Up Consult:  Returned with in house Spanish interpreter to set up DEBP for mother.  Pump parts, assembly and cleaning reviewed.  Observed mother pumping for 5 minutes and colostrum was starting to flow.  Encouraged parents to feed back any EBM mother obtains from pumping.  If baby does not latch and feed well we have options for supplementation which I discussed with parents.  Also encouraged lots of STS and frequent feedings.  Mother verbalized understanding.   Maternal Data Has patient been taught Hand Expression?: Yes Does the patient have breastfeeding experience prior to this delivery?: No  Feeding Mother's Current Feeding Choice: Breast Milk  LATCH Score Latch: Grasps breast easily, tongue down, lips flanged, rhythmical sucking.  Audible Swallowing: A few with stimulation  Type of Nipple: Everted at rest and after stimulation  Comfort (Breast/Nipple): Soft / non-tender  Hold (Positioning): Assistance needed to correctly position infant at breast and maintain latch.  LATCH Score: 8   Lactation Tools Discussed/Used Tools: Pump;Flanges Flange Size: 24 Breast pump type: Double-Electric Breast Pump;Manual Pump Education: Setup, frequency, and cleaning Reason for Pumping: Breast stimulation for supplementation Pumping frequency: Every three hours  Interventions Interventions: Breast feeding basics reviewed;Assisted with latch;Skin to skin;Breast compression;Position options;Support pillows;Adjust position;Education  Discharge Pump: DEBP;Manual;Personal WIC Program: Yes (Mother will be accepting the formula package from [facility])  Consult Status Consult Status: Follow-up Date: 04/06/21 Follow-up type:  In-patient    Eugean Arnott R Alton Tremblay 04/05/2021, 9:16 AM

## 2021-04-05 NOTE — Progress Notes (Addendum)
POSTPARTUM PROGRESS NOTE  Subjective: Barbara Montgomery is a 20 y.o. G1P1001 s/p SVD at [redacted]w[redacted]d.  She reports she is doing well. No acute events overnight. She denies any problems with ambulating, voiding or po intake. Denies nausea or vomiting. She has passed flatus. She has not had bowel movement. Reports she has a history of chronic constipation and currently does not have bloating. Pain is well controlled.  Lochia is Minimal.  Afebrile overnight.  Objective: BP (!) 88/47 (BP Location: Right Arm)   Pulse 74   Temp 97.8 F (36.6 C) (Oral)   Resp 18   Ht 4\' 10"  (1.473 m)   Wt 61.2 kg   LMP 07/18/2020   SpO2 99%   Breastfeeding Unknown   BMI 28.22 kg/m   Physical Exam:  General: alert, cooperative and no distress Chest: CTAB, no respiratory distress Abdomen: soft, non-tender  Uterine Fundus: firm, appropriately tender Extremities: No calf swelling, tenderness, or edema  Recent Labs    04/03/21 1856  HGB 9.7*  HCT 32.2*    Assessment/Plan: Barbara Montgomery is a 20 y.o. G1P1001 s/p SVD at [redacted]w[redacted]d with IOL due to cholestasis.   LOS: 2 days   PPD#1: Doing well, pain well-controlled.  -- Routine postpartum care, lactation support -- Encouraged up OOB -- Contraception:  undecided  - needs counseling -- Feeding: breast feeding and bottle feeding, working with lactation (assisted to help get deeper latch)  Suspected Intrapartum Triple I -- Afebrile overnight, no concern for infection now -- S/p gentamycin  Acute blood loss anemia Hgb at admission 9.7, known hx of anemia. EBL at delivery 300cc. Currently asymptomatic - start PO Iron every other day  Dispo: Plan for discharge tomorrow.  [redacted]w[redacted]d, D.O. [facility] PGY-1  GME ATTESTATION:  I saw and evaluated the patient. I agree with the findings and the plan of care as documented in the resident's note and have made all necessary edits.  Darral Dash, MD, MPH OB Fellow, Faculty  Practice [facility], Center for [facility] 04/05/2021 10:59 PM

## 2021-04-05 NOTE — Lactation Note (Signed)
This note was copied from a baby's chart. Lactation Consultation Note  Patient Name: Barbara Montgomery Date: 04/05/2021   Age:21 hours RN Helmut Muster) informed LC mom would like to be seen by [facility] services in morning.  Maternal Data    Feeding    LATCH Score Latch: Grasps breast easily, tongue down, lips flanged, rhythmical sucking.  Audible Swallowing: Spontaneous and intermittent  Type of Nipple: Everted at rest and after stimulation  Comfort (Breast/Nipple): Filling, red/small blisters or bruises, mild/mod discomfort  Hold (Positioning): Assistance needed to correctly position infant at breast and maintain latch.  LATCH Score: 8   Lactation Tools Discussed/Used    Interventions Interventions: Assisted with latch (helped to get deeper latch)  Discharge    Consult Status      Danelle Earthly 04/05/2021, 1:39 AM

## 2021-04-05 NOTE — Lactation Note (Signed)
This note was copied from a baby's chart. Lactation Consultation Note  Patient Name: Barbara Montgomery EXNTZ'G Date: 04/05/2021 Reason for consult: Initial assessment;Early term 37-38.6wks;Primapara;1st time breastfeeding Age:21 hours   P1 mother whose infant is now 37 hours old.  This is an ETI at 37+1 weeks with a 6% weight loss this morning.  Mother's feeding preference is breast.  In house Spanish interpreter not available at this time.  Spanish speaking RN used for interpretation.  RN in room assisting with care.  Discussed baby's weight loss with the parents and the importance of feeding at least every three hours or sooner if "[name]" desires.  Reviewed cues and hand expression; mother able to express drops.  Assisted to latch easily and taught proper body alignment and positioning.  Observed baby feeding for 5 minutes with a wide gape and flanged lips; mother denied pain.  Offered to initiate the DEBP and mother receptive.  RN will complete her teaching now and I will return after the feeding to set up the pump.  Mom made aware of O/P services, breastfeeding support groups, community resources, and our phone # for post-discharge questions.  Mother has a DEBP for home use.  Father present and taught how to assist mother.   Maternal Data Has patient been taught Hand Expression?: Yes Does the patient have breastfeeding experience prior to this delivery?: No  Feeding Mother's Current Feeding Choice: Breast Milk  LATCH Score Latch: Grasps breast easily, tongue down, lips flanged, rhythmical sucking.  Audible Swallowing: A few with stimulation  Type of Nipple: Everted at rest and after stimulation  Comfort (Breast/Nipple): Soft / non-tender  Hold (Positioning): Assistance needed to correctly position infant at breast and maintain latch.  LATCH Score: 8   Lactation Tools Discussed/Used    Interventions Interventions: Breast feeding basics reviewed;Assisted with  latch;Skin to skin;Breast compression;Position options;Support pillows;Adjust position;Education  Discharge Pump: Personal;DEBP;Manual  Consult Status Consult Status: Follow-up Date: 04/06/21 Follow-up type: In-patient    Shannon Kirkendall R Alphia Behanna 04/05/2021, 8:15 AM

## 2021-04-06 DIAGNOSIS — O0990 Supervision of high risk pregnancy, unspecified, unspecified trimester: Secondary | ICD-10-CM

## 2021-04-06 DIAGNOSIS — O411290 Chorioamnionitis, unspecified trimester, not applicable or unspecified: Secondary | ICD-10-CM

## 2021-04-06 HISTORY — DX: Supervision of high risk pregnancy, unspecified, unspecified trimester: O09.90

## 2021-04-06 HISTORY — DX: Chorioamnionitis, unspecified trimester, not applicable or unspecified: O41.1290

## 2021-04-06 LAB — SURGICAL PATHOLOGY

## 2021-04-06 MED ORDER — IBUPROFEN 600 MG PO TABS: 600.0000 mg | ORAL_TABLET | Freq: Four times a day (QID) | ORAL | 0 refills | Status: DC

## 2021-04-06 MED ORDER — FERROUS SULFATE 325 (65 FE) MG PO TABS: 325.0000 mg | ORAL_TABLET | ORAL | 0 refills | Status: DC

## 2021-04-06 MED ORDER — ACETAMINOPHEN 325 MG PO TABS
650.0000 mg | ORAL_TABLET | ORAL | 0 refills | Status: DC | PRN
Start: 2021-04-06 — End: 2022-11-30

## 2021-04-06 NOTE — Lactation Note (Signed)
This note was copied from a baby's chart. Lactation Consultation Note  Patient Name: Girl Rosaria Kubin BOFBP'Z Date: 04/06/2021 Reason for consult: Follow-up assessment;1st time breastfeeding;Primapara;Early term 37-38.6wks;Infant weight loss;Other (Comment);Nipple pain/trauma (8 % weight loss / Serum Bili WNL /- Raelyn Ensign - into interpreter _ Spanish) Age:21 hours LC reviewed BF D/C teaching - with 8 % weight loss  For today until the 1st weight check- feed with feeding cues and by 3 hours  Due to sore nipples( positional strips ) LC recommended - 1st breast massage, hand express , prepump with hand pump prime the milk ducts.  Latch with firm support.  Use EBM to nipples / coconut oil ( a dab will due )  Offer 2nd breast if baby still hungry and top off with supplement until 1st weight check.  Post pump both breast after baby is settled / save milk and feed back to the baby.  See below for BF D/C teaching.  Per mom has English speaking friend whom can call LC office if issues with BF.   Maternal Data Has patient been taught Hand Expression?: Yes  Feeding Mother's Current Feeding Choice: Breast Milk and Donor Milk  LATCH Score - Latch range 8-9 / baby had fed / LC unable to assess latch.    Lactation Tools Discussed/Used Tools: Pump Flange Size: 24;27 Breast pump type: Double-Electric Breast Pump Pump Education: Setup, frequency, and cleaning;Milk Storage;Other (comment) (MBURN will review storage of BM) Reason for Pumping: due 8 % weight loss / ET Pumped volume:  (per mom drops)  Interventions    Discharge Discharge Education: Engorgement and breast care;Warning signs for feeding baby Pump: DEBP;Personal;Manual  Consult Status Consult Status: Complete Date: 04/06/21    Matilde Sprang [facility] 04/06/2021, 11:22 AM

## 2021-04-08 ENCOUNTER — Emergency Department (HOSPITAL_COMMUNITY)
Admission: EM | Admit: 2021-04-08 | Discharge: 2021-04-08 | Discharge status: Against Medical Advice | Payer: Medicaid Other | Attending: Emergency Medicine | Admitting: Emergency Medicine

## 2021-04-17 ENCOUNTER — Telehealth (HOSPITAL_COMMUNITY): Payer: Self-pay | Admitting: *Deleted

## 2021-04-17 NOTE — Telephone Encounter (Signed)
Hospital discharge follow-up call completed with Language Line interpreter, Rella Larve [phone]. Patient voiced no questions or concerns at this time. EPDS=1. Patient voiced no questions or concerns regarding infant at this time. Patient reports infant sleeps in a crib on her back. RN reviewed ABCs of safe sleep. Patient verbalized understanding. Deforest Hoyles, RN, 04/17/21, [phone]

## 2021-10-27 IMAGING — US US OB < 14 WEEKS - US OB TV
1 series · 15 of 28 positions shown · non-contrast
Comparison: None

CLINICAL DATA: Abdominal pain in first trimester of pregnancy, LMP
07/18/2020

EXAM:
OBSTETRIC <14 WK US AND TRANSVAGINAL OB US
TECHNIQUE: Both transabdominal and transvaginal ultrasound examinations were
performed for complete evaluation of the gestation as well as the
maternal uterus, adnexal regions, and pelvic cul-de-sac.
Transvaginal technique was performed to assess early pregnancy.

[Series 1: us ob < 14 weeks - us ob tv · 15 of 63 slices shown]
[im 1/63]
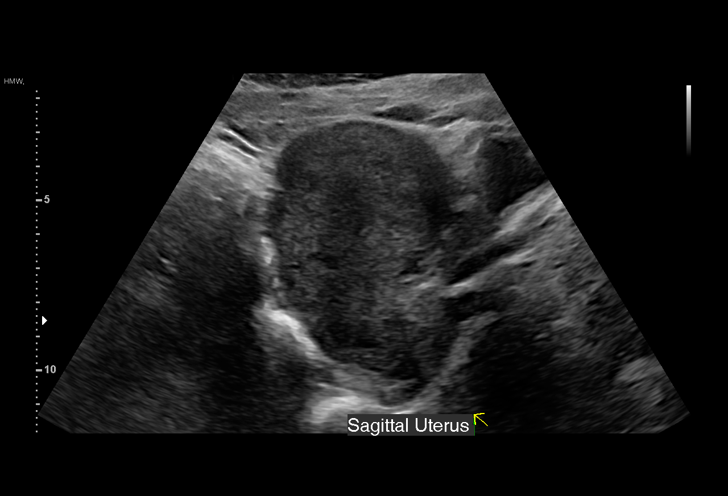
[im 5/63]
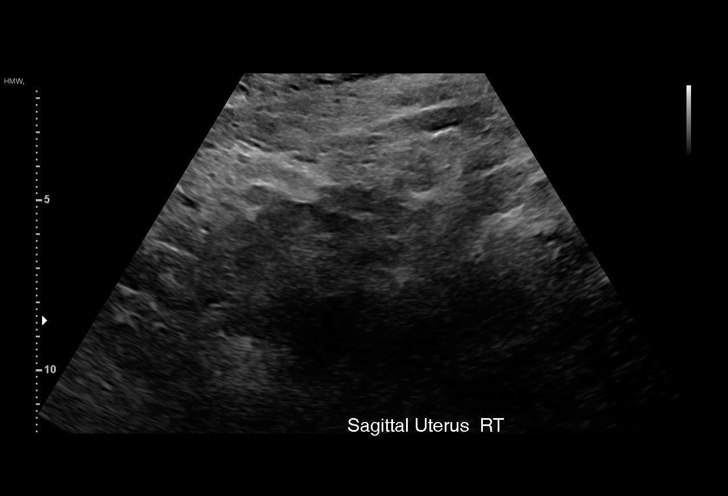
[im 10/63]
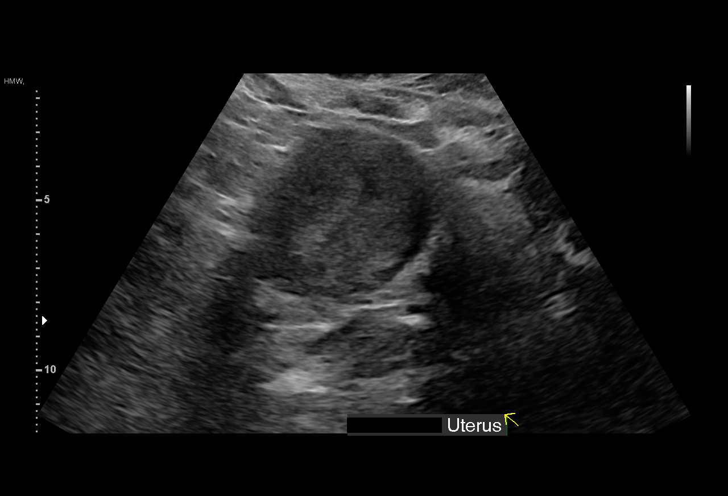
[im 14/63]
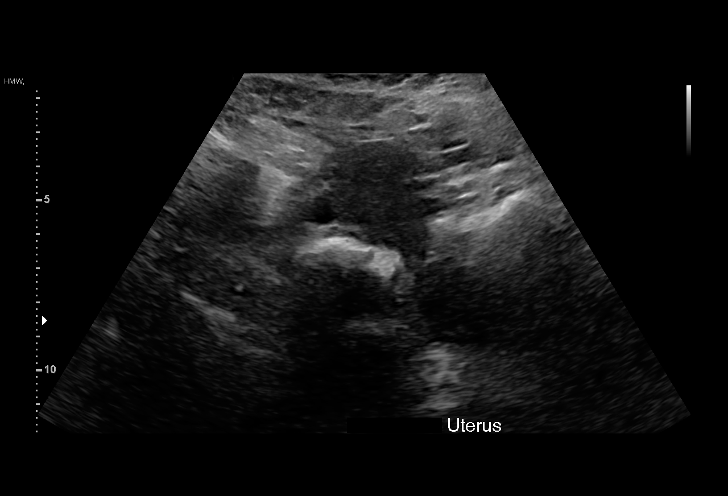
[im 19/63]
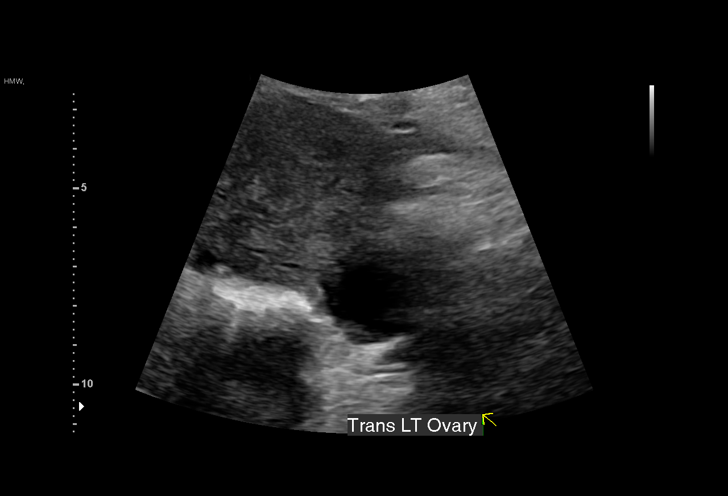
[im 23/63]
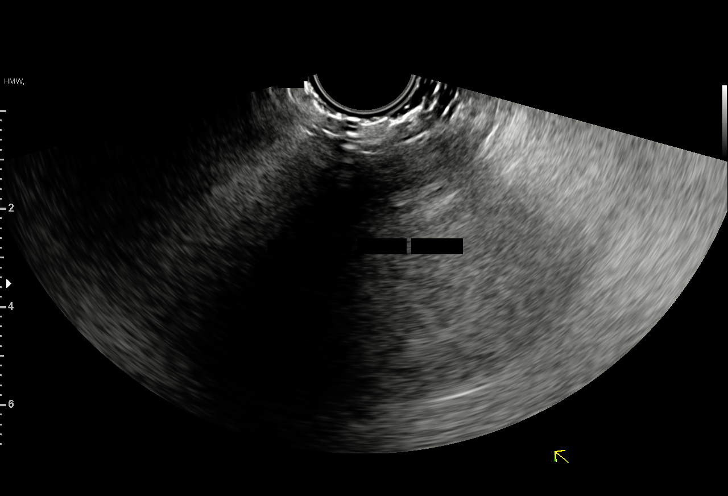
[im 28/63]
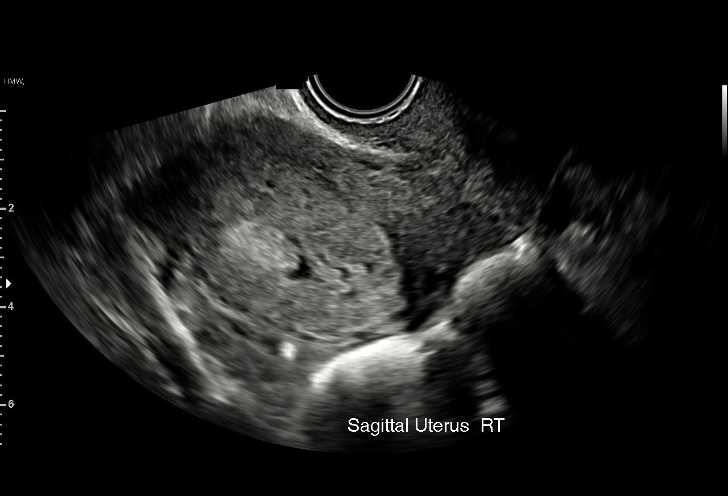
[im 33/63]
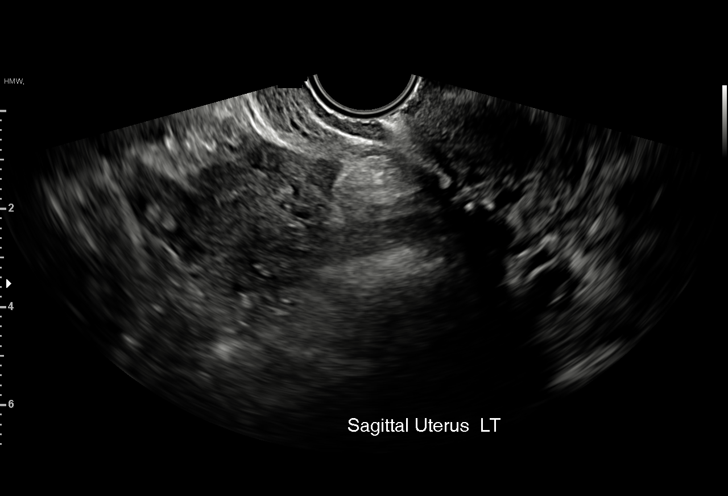
[im 35/63]
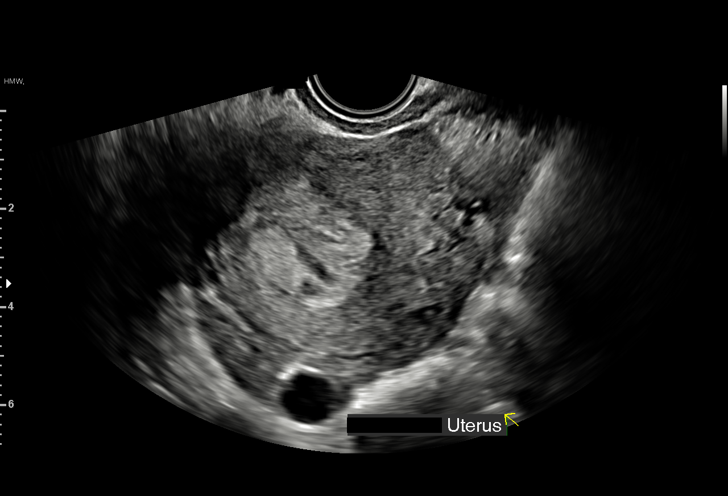
[im 40/63]
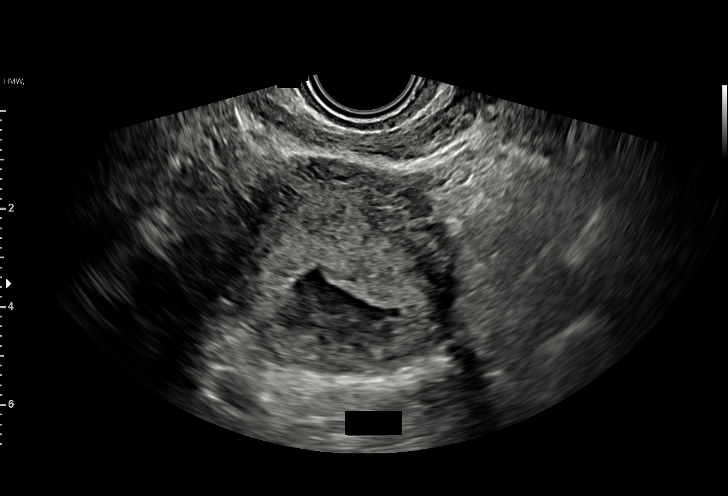
[im 44/63]
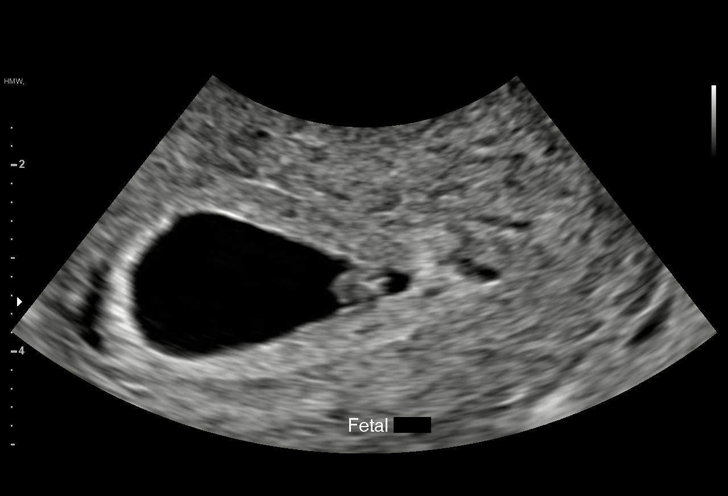
[im 49/63]
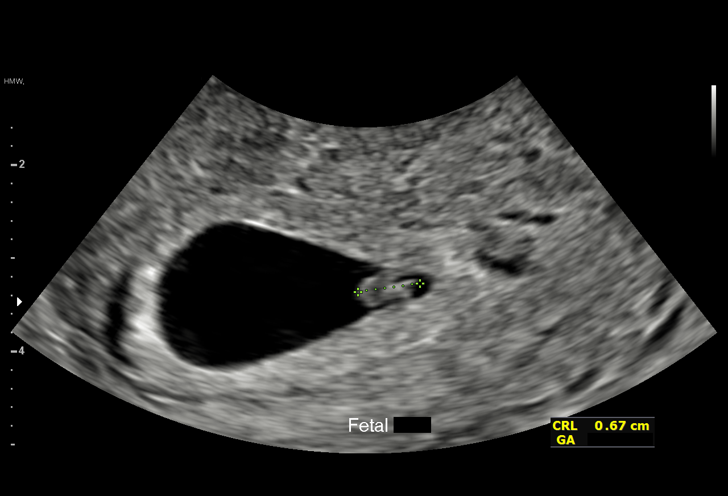
[im 53/63]
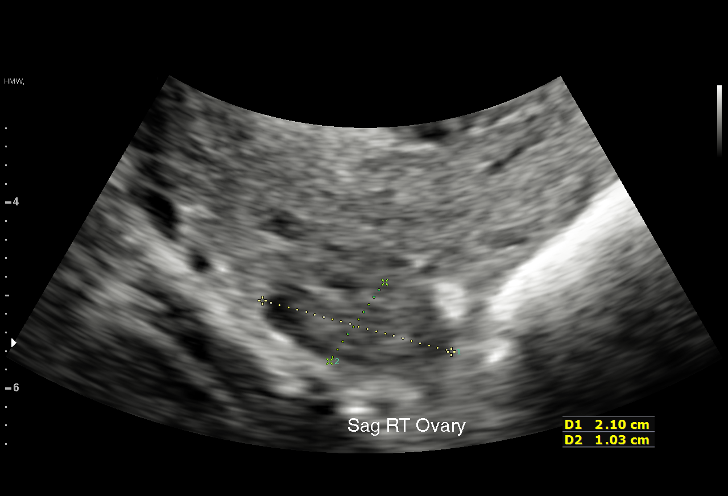
[im 58/63]
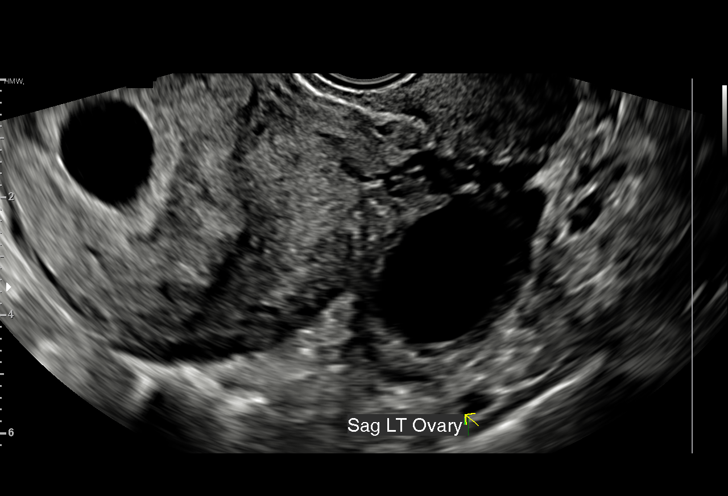
[im 63/63]
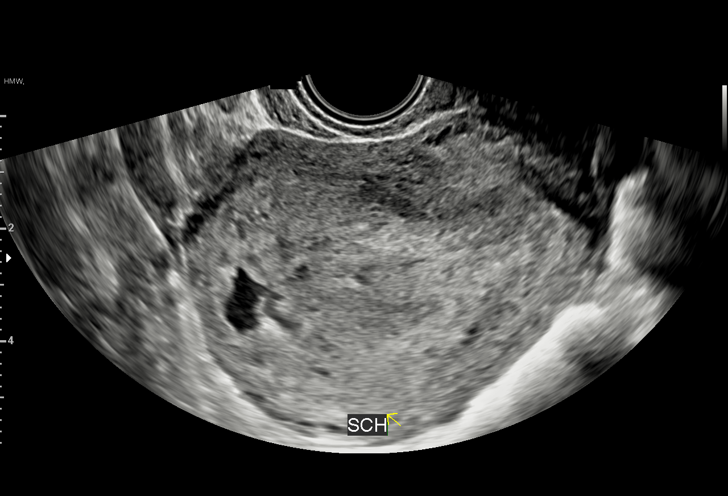

[15 of 28 positions shown; findings below may reference images not displayed]

FINDINGS: Intrauterine gestational sac: Present, single

Yolk sac:  Present

Embryo:  Present

Cardiac Activity: Present

Heart Rate: 127 bpm

CRL:  6.4 mm   6 w   3 d                  US EDC: 04/24/2021

Subchorionic hemorrhage:  Small subchronic hemorrhage

Maternal uterus/adnexae:

Remainder of uterus unremarkable.

Ovaries normal appearance.

No free pelvic fluid or adnexal masses.
IMPRESSION: Single live intrauterine gestation at 6 weeks 3 days EGA by
crown-rump length.

Small subchronic hemorrhage.

## 2022-02-11 IMAGING — US US MFM OB COMP +14 WKS
1 series · 13 of 28 positions shown · non-contrast
Comparison: none

[Series 1: us mfm ob comp +14 wks · 147 acquisitions, 13 frames shown]
[im 6/147]
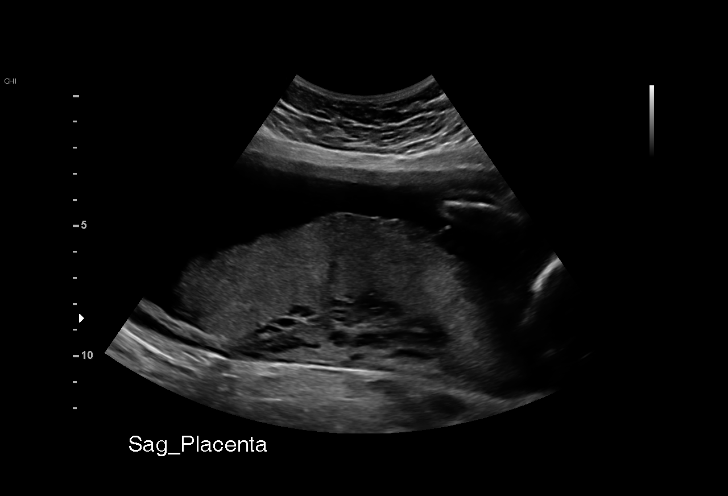
[im 17/147]
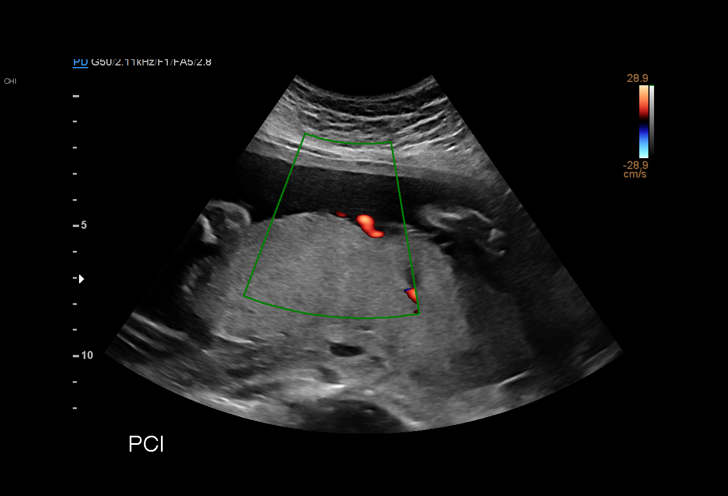
[im 28/147]
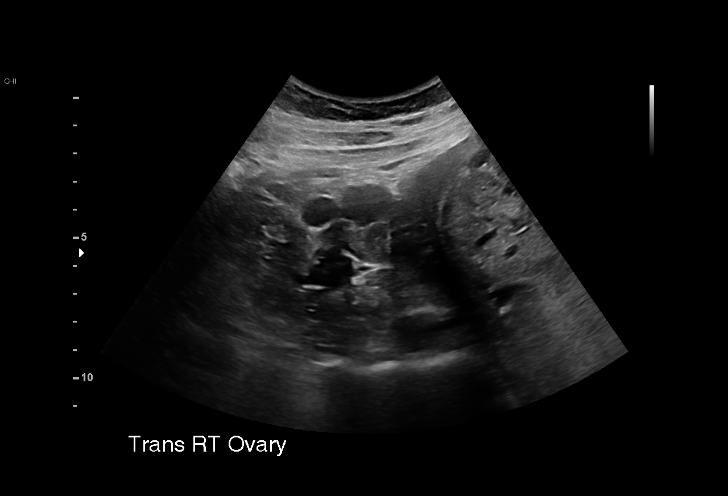
[im 38/147]
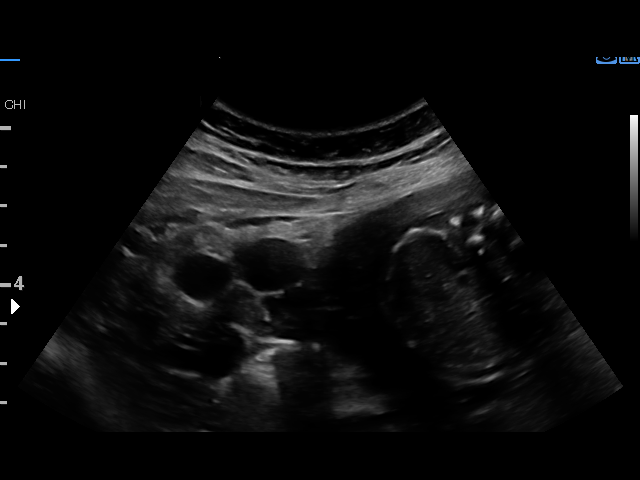
[im 49/147]
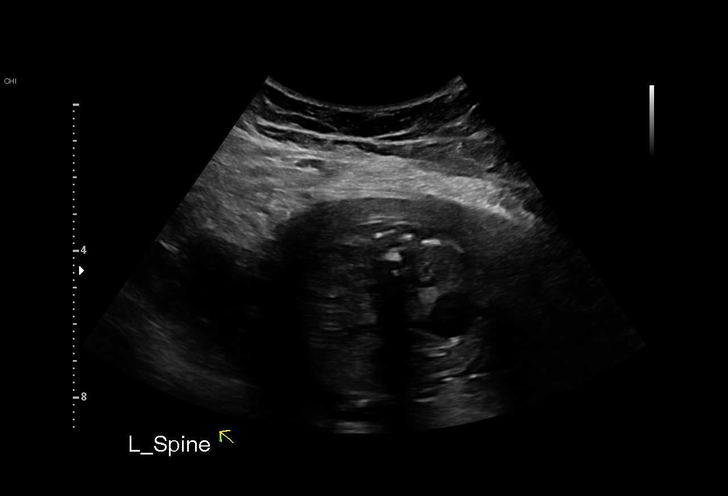
[im 60/147]
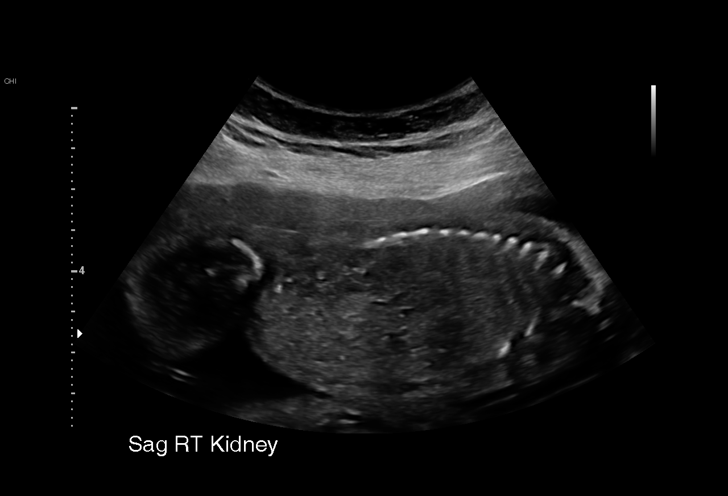
[im 76/147]
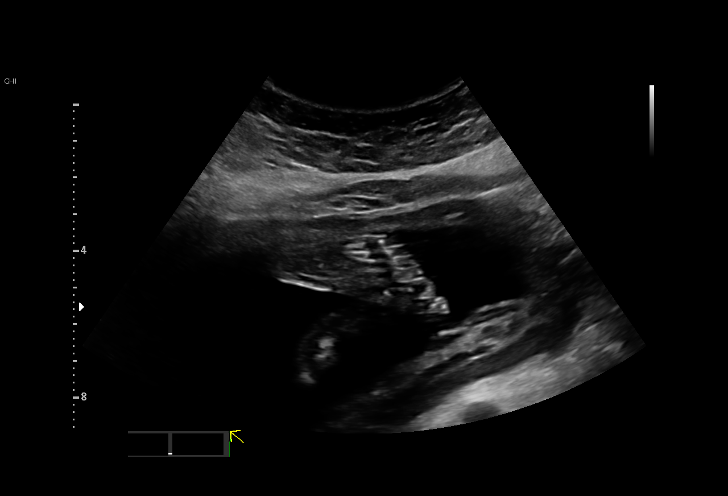
[im 87/147]
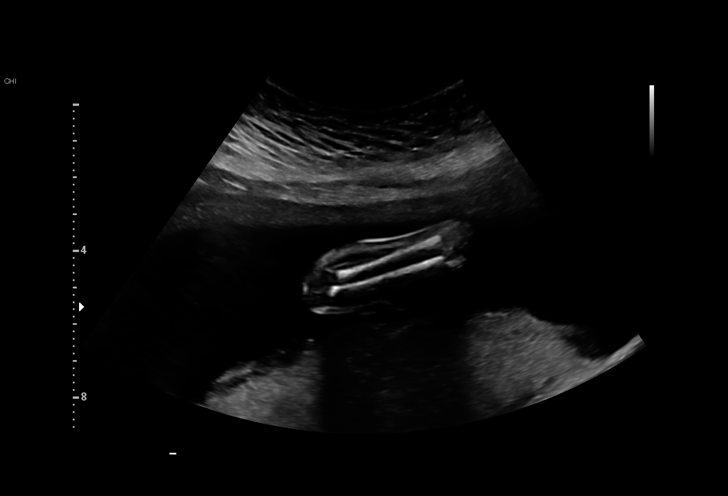
[im 98/147]
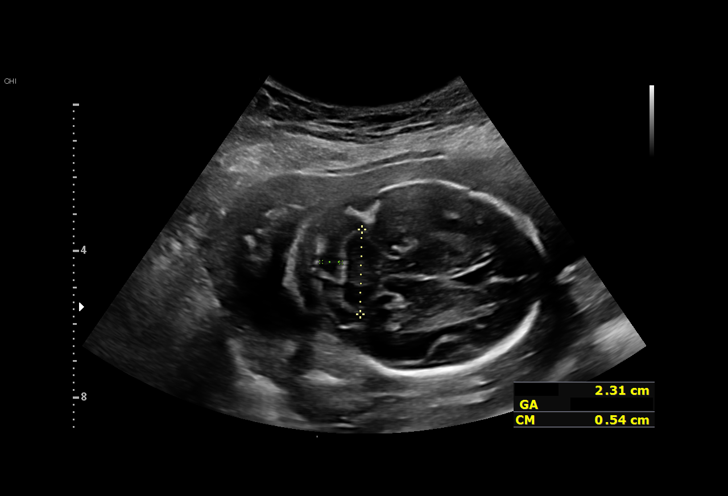
[im 109/147]
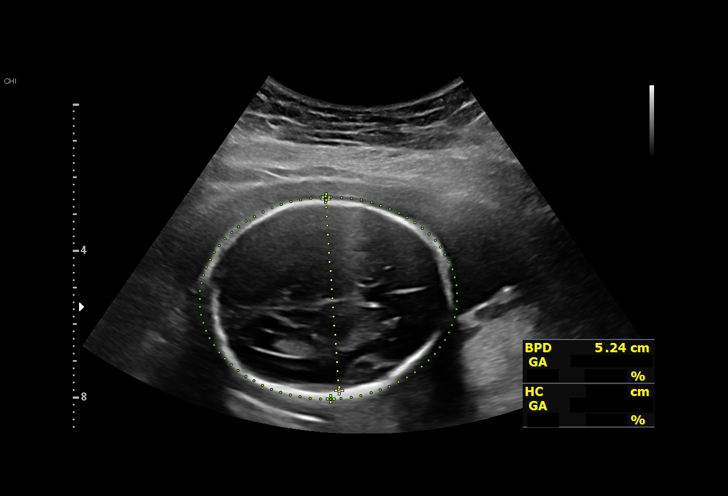
[im 119/147]
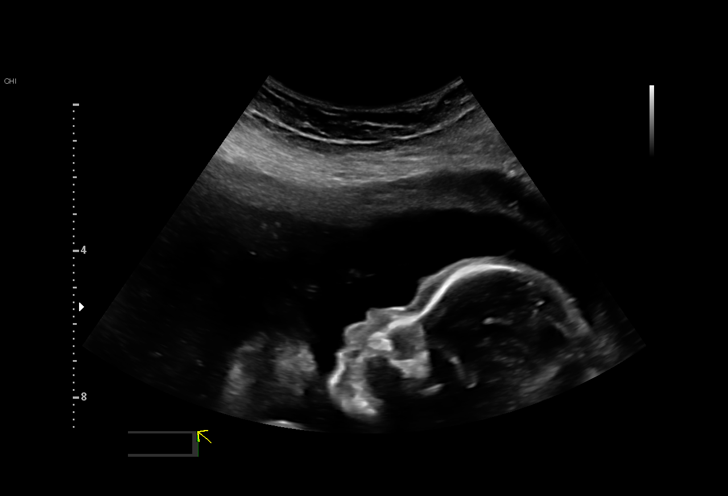
[im 130/147]
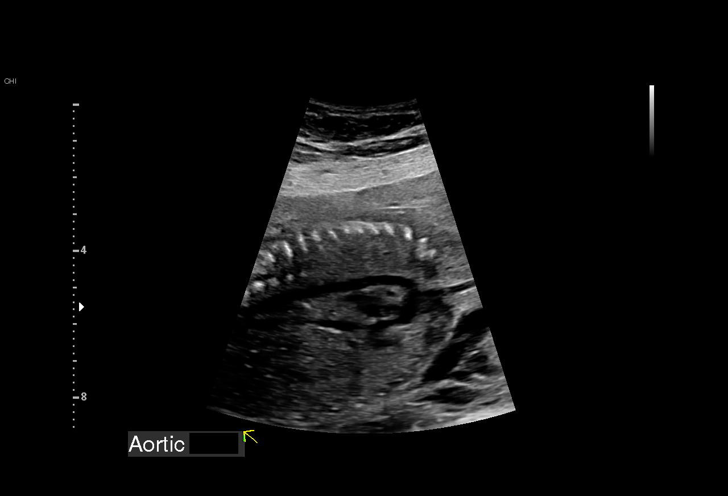
[im 141/147]
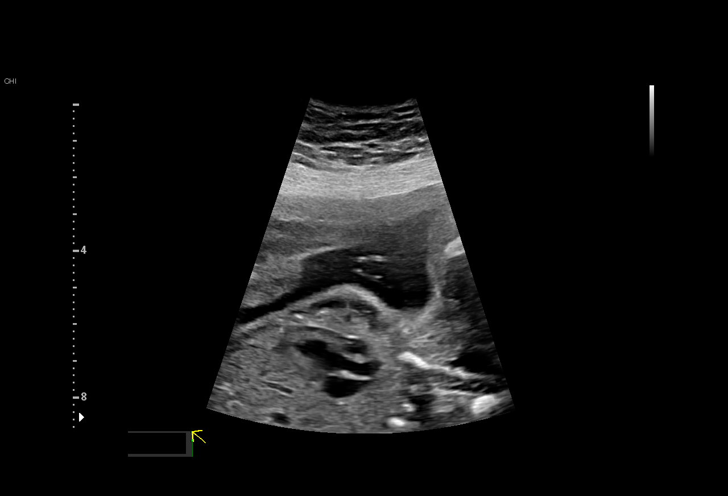

[13 of 28 positions shown; findings below may reference images not displayed]

[place]

                                                            [place]

 1  US MFM OB COMP + 14 WK                76805.01    CHARLINE TALAMANTES

Indications

 21 weeks gestation of pregnancy
 Encounter for antenatal screening for
 malformations
Fetal Evaluation

 Num Of Fetuses:         1
 Fetal Heart Rate(bpm):  147
 Cardiac Activity:       Observed
 Presentation:           Cephalic
 Placenta:               Posterior
 P. Cord Insertion:      Visualized, central

 Amniotic Fluid
 AFI FV:      Within normal limits

                             Largest Pocket(cm)

Biometry

 BPD:      52.7  mm     G. Age:  22w 0d         59  %    CI:        71.04   %    70 - 86
                                                         FL/HC:      18.9   %    18.4 -
 HC:      199.2  mm     G. Age:  22w 1d         55  %    HC/AC:      1.11        1.06 -
 AC:      179.9  mm     G. Age:  22w 6d         78  %    FL/BPD:     71.3   %    71 - 87
 FL:       37.6  mm     G. Age:  22w 0d         50  %    FL/AC:      20.9   %    20 - 24
 HUM:      36.2  mm     G. Age:  22w 5d         70  %
 CER:      23.1  mm     G. Age:  21w 3d         59  %

 LV:        5.1  mm
 CM:        5.4  mm

 Est. FW:     502  gm      1 lb 2 oz     80  %
OB History

 Gravidity:    1
Gestational Age

 LMP:           21w 5d        Date:  07/18/20                 EDD:   04/24/21
 U/S Today:     22w 2d                                        EDD:   04/20/21
 Best:          21w 5d     Det. By:  LMP  (07/18/20)          EDD:   04/24/21
Anatomy

 Cranium:               Appears normal         LVOT:                   Appears normal
 Cavum:                 Appears normal         Aortic Arch:            Appears normal
 Ventricles:            Appears normal         Ductal Arch:            Not well visualized
 Choroid Plexus:        Appears normal         Diaphragm:              Appears normal
 Cerebellum:            Appears normal         Stomach:                Appears normal, left
                                                                       sided
 Posterior Fossa:       Appears normal         Abdomen:                Appears normal
 Nuchal Fold:           Not applicable (>20    Abdominal Wall:         Appears nml (cord
                        wks GA)                                        insert, abd wall)
 Face:                  Appears normal         Cord Vessels:           Appears normal (3
                        (orbits and profile)                           vessel cord)
 Lips:                  Appears normal         Kidneys:                Appear normal
 Palate:                Appears normal         Bladder:                Appears normal
 Thoracic:              Appears normal         Spine:                  Appears normal
 Heart:                 Appears normal         Upper Extremities:      Appears normal
                        (4CH, axis, and
                        situs)
 RVOT:                  Appears normal         Lower Extremities:      Appears normal

 Other:  Fetus appears to be female. Heels/feet and open hands visualized.
         Nasal bone and lenses visualized. Technically difficult due to fetal
         position.
Cervix Uterus Adnexa

 Cervix
 Length:           3.07  cm.
 Normal appearance by transabdominal scan.

 Uterus
 No abnormality visualized.

 Right Ovary
 Within normal limits.

 Left Ovary
 Within normal limits.

 Cul De Sac
 No free fluid seen.

 Adnexa
 Rt adnexal cystic tubular structure
Impression

 G1 P0.  Patient is here for fetal anatomy scan.  She had
 opted not to have screening for fetal aneuploidies.

 We performed fetal anatomy scan. No makers of
 aneuploidies or fetal structural defects are seen. Fetal
 biometry is consistent with her previously-established dates.
 Amniotic fluid is normal and good fetal activity is seen.
 Patient understands the limitations of ultrasound in detecting
 fetal anomalies.
 In the right adnexa a tubular structure is seen consistent with
 hydrosalpinx.  Patient does not have symptoms of abdominal
 pain.  I counseled her on the possibility of hydrosalpinx
 versus varicosities.  I recommended postpartum evaluation
Recommendations

 Follow-up scans as clinically indicated.
                 Pipa, Carlos Geovanny

## 2022-05-16 NOTE — L&D Delivery Note (Signed)
OB/GYN Faculty Practice Delivery Note  Barbara Montgomery is a 23 y.o. G2P1001 s/p SVD at [redacted]w[redacted]d. She was admitted for IOL 2/2 cholestasis .   ROM: 0h 45m with moderate meconium fluid GBS Status: unknown but received  adequate PCN  Maximum Maternal Temperature: 98.39F  Labor Progress: Initial SVE: 1/thick/-3. She then progressed to complete.   Delivery Date/Time: 1539 04/16/23 Delivery: Called to room and patient was complete and pushing. Head delivered LOA. No nuchal cord present. Shoulder and body delivered in usual fashion. Infant with spontaneous cry, placed on mother's abdomen, dried and stimulated. Cord clamped x 2 after 1-minute delay, and cut by [name]. Cord blood drawn. Placenta delivered spontaneously with gentle cord traction. Fundus firm with massage and Pitocin. Labia, perineum, vagina, and cervix inspected without laceration.  Baby Weight: pending  Placenta: 3 vessel, intact. Sent to [name] Complications: None Lacerations: None EBL: 57 mL Analgesia: Epidural   Infant:  APGAR (1 MIN): 9  APGAR (5 MINS): 9   Barbara Dibble, MD [facility] Fellow, [facility] for [facility], [facility] 04/16/2023, 4:07 PM

## 2022-11-10 ENCOUNTER — Other Ambulatory Visit: Payer: Self-pay | Admitting: Obstetrics & Gynecology

## 2022-11-10 DIAGNOSIS — Z363 Encounter for antenatal screening for malformations: Secondary | ICD-10-CM

## 2022-11-10 LAB — OB RESULTS CONSOLE HIV ANTIBODY (ROUTINE TESTING): HIV: NONREACTIVE

## 2022-11-10 LAB — OB RESULTS CONSOLE HEPATITIS B SURFACE ANTIGEN: Hepatitis B Surface Ag: NEGATIVE

## 2022-11-10 LAB — HEPATITIS C ANTIBODY: HCV Ab: NEGATIVE

## 2022-11-10 LAB — OB RESULTS CONSOLE GC/CHLAMYDIA
Chlamydia: NEGATIVE
Neisseria Gonorrhea: NEGATIVE

## 2022-11-10 LAB — OB RESULTS CONSOLE RUBELLA ANTIBODY, IGM: Rubella: IMMUNE

## 2022-11-10 LAB — OB RESULTS CONSOLE ANTIBODY SCREEN: Antibody Screen: NEGATIVE

## 2022-11-10 LAB — OB RESULTS CONSOLE RPR: RPR: NONREACTIVE

## 2022-11-30 ENCOUNTER — Other Ambulatory Visit: Payer: Self-pay | Admitting: *Deleted

## 2022-11-30 ENCOUNTER — Other Ambulatory Visit: Payer: Self-pay | Admitting: Obstetrics & Gynecology

## 2022-11-30 ENCOUNTER — Ambulatory Visit: Payer: Medicaid Other | Admitting: *Deleted

## 2022-11-30 ENCOUNTER — Ambulatory Visit: Payer: Medicaid Other | Attending: Obstetrics & Gynecology

## 2022-11-30 ENCOUNTER — Encounter: Payer: Self-pay | Admitting: *Deleted

## 2022-11-30 VITALS — BP 88/44 | HR 74

## 2022-11-30 DIAGNOSIS — Z363 Encounter for antenatal screening for malformations: Secondary | ICD-10-CM

## 2022-11-30 DIAGNOSIS — Z3689 Encounter for other specified antenatal screening: Secondary | ICD-10-CM

## 2022-11-30 DIAGNOSIS — Z362 Encounter for other antenatal screening follow-up: Secondary | ICD-10-CM

## 2022-12-29 ENCOUNTER — Ambulatory Visit: Payer: Medicaid Other | Attending: Obstetrics and Gynecology

## 2022-12-29 ENCOUNTER — Ambulatory Visit: Payer: Medicaid Other | Admitting: *Deleted

## 2022-12-29 VITALS — BP 96/48 | HR 92

## 2022-12-29 DIAGNOSIS — Z362 Encounter for other antenatal screening follow-up: Secondary | ICD-10-CM | POA: Diagnosis present

## 2022-12-29 DIAGNOSIS — O09292 Supervision of pregnancy with other poor reproductive or obstetric history, second trimester: Secondary | ICD-10-CM | POA: Diagnosis not present

## 2022-12-29 DIAGNOSIS — O26643 Intrahepatic cholestasis of pregnancy, third trimester: Secondary | ICD-10-CM | POA: Diagnosis present

## 2022-12-29 DIAGNOSIS — Z3A21 21 weeks gestation of pregnancy: Secondary | ICD-10-CM

## 2023-01-23 ENCOUNTER — Ambulatory Visit: Payer: Medicaid Other

## 2023-03-10 ENCOUNTER — Telehealth: Payer: Medicaid Other

## 2023-03-10 DIAGNOSIS — Z3490 Encounter for supervision of normal pregnancy, unspecified, unspecified trimester: Secondary | ICD-10-CM

## 2023-03-11 ENCOUNTER — Inpatient Hospital Stay (HOSPITAL_COMMUNITY)
Admission: AD | Admit: 2023-03-11 | Discharge: 2023-03-12 | Disposition: A | Discharge status: Home / Self Care | Payer: Medicaid Other | Attending: Obstetrics and Gynecology | Admitting: Obstetrics and Gynecology

## 2023-03-11 ENCOUNTER — Encounter (HOSPITAL_COMMUNITY): Payer: Self-pay | Admitting: Obstetrics and Gynecology

## 2023-03-11 DIAGNOSIS — R300 Dysuria: Secondary | ICD-10-CM | POA: Insufficient documentation

## 2023-03-11 DIAGNOSIS — O26893 Other specified pregnancy related conditions, third trimester: Secondary | ICD-10-CM | POA: Insufficient documentation

## 2023-03-11 DIAGNOSIS — O99891 Other specified diseases and conditions complicating pregnancy: Secondary | ICD-10-CM | POA: Diagnosis not present

## 2023-03-11 DIAGNOSIS — R109 Unspecified abdominal pain: Secondary | ICD-10-CM | POA: Diagnosis not present

## 2023-03-11 DIAGNOSIS — M542 Cervicalgia: Secondary | ICD-10-CM | POA: Insufficient documentation

## 2023-03-11 DIAGNOSIS — Z3A35 35 weeks gestation of pregnancy: Secondary | ICD-10-CM | POA: Insufficient documentation

## 2023-03-11 DIAGNOSIS — K5900 Constipation, unspecified: Secondary | ICD-10-CM | POA: Insufficient documentation

## 2023-03-11 DIAGNOSIS — M5489 Other dorsalgia: Secondary | ICD-10-CM | POA: Insufficient documentation

## 2023-03-11 DIAGNOSIS — O99613 Diseases of the digestive system complicating pregnancy, third trimester: Secondary | ICD-10-CM | POA: Insufficient documentation

## 2023-03-11 DIAGNOSIS — Z87440 Personal history of urinary (tract) infections: Secondary | ICD-10-CM | POA: Insufficient documentation

## 2023-03-11 LAB — URINALYSIS, ROUTINE W REFLEX MICROSCOPIC
Bilirubin Urine: NEGATIVE
Glucose, UA: 50 mg/dL — AB
Hgb urine dipstick: NEGATIVE
Ketones, ur: NEGATIVE mg/dL
Leukocytes,Ua: NEGATIVE
Nitrite: NEGATIVE
Protein, ur: NEGATIVE mg/dL
Specific Gravity, Urine: 1.024 (ref 1.005–1.030)
pH: 6 (ref 5.0–8.0)

## 2023-03-11 NOTE — Progress Notes (Signed)
For the safety of you and your child, I recommend a face to face office visit with a health care provider.  Many mothers need to take medicines during their pregnancy and while nursing.  Almost all medicines pass into the breast milk in small quantities.  Most are generally considered safe for a mother to take but some medicines must be avoided.  After reviewing your E-Visit request, I recommend that you consult your OB/GYN or pediatrician for medical advice in relation to your condition and prescription medications while pregnant or breastfeeding.  NOTE:  There will be NO CHARGE for this eVisit  If you are having a true medical emergency please call 911.    For an urgent face to face visit, [place] has six urgent care centers for your convenience:     [facility] [facility] [place] Get Driving Directions [id number] [address] [place], [place] [address]    [facility]) Get Driving Directions [id number] [place], [place] [address]  [facility] [place] ([place]) Get Driving Directions [id number] [address] [place],  [place]  [address]  [facility]  Get Driving Directions [id number] [address] [place], [place], [place] [address]   [facility] [place] Get Driving Directions  [id number] [address] [place], [place] [address]   [facility] [place] Get Driving Directions [id number] [address], [place], [place] [address]  Your MyChart E-visit questionnaire answers were reviewed by a board certified advanced clinical practitioner to complete your personal care plan based on your specific symptoms.  Thank you for using e-Visits.   have provided 5 minutes of non face to face time during this encounter for chart review and documentation.

## 2023-03-11 NOTE — MAU Note (Signed)
.  Barbara Montgomery is a 22 y.o. at [redacted]w[redacted]d here in MAU reporting:  Pt states that for three days now she has this burning/tingling feeling like "ants" on her upper back/back of neck area. Rates this pain at 10/10.  Pt also states that for the past three days she has been having ctxs and they have become worse today.  Pt also mentions she has been having pain with urination and after urination. Pt states she gets frequent UTIs and none of the medications have worked. Pt states she has moderate discharge "creamy" but no bleeding and has not had any recent intercourse.   +FM. Denis any LOF.  Pt states she has not taken any pain medication.    Vitals:   03/11/23 2326  BP: 105/64  Pulse: 69  Resp: 15  Temp: 97.9 F (36.6 C)  SpO2: 98%    FHT:129 Lab orders placed from triage:   UA.

## 2023-03-12 DIAGNOSIS — R109 Unspecified abdominal pain: Secondary | ICD-10-CM | POA: Diagnosis not present

## 2023-03-12 DIAGNOSIS — M542 Cervicalgia: Secondary | ICD-10-CM | POA: Diagnosis not present

## 2023-03-12 DIAGNOSIS — Z3A35 35 weeks gestation of pregnancy: Secondary | ICD-10-CM | POA: Diagnosis not present

## 2023-03-12 DIAGNOSIS — R300 Dysuria: Secondary | ICD-10-CM

## 2023-03-12 DIAGNOSIS — O26893 Other specified pregnancy related conditions, third trimester: Secondary | ICD-10-CM | POA: Diagnosis not present

## 2023-03-12 MED ORDER — CYCLOBENZAPRINE HCL 5 MG PO TABS
5.0000 mg | ORAL_TABLET | Freq: Once | ORAL | Status: AC
  Administered 2023-03-12: 5 mg via ORAL
  Filled 2023-03-12: qty 1

## 2023-03-12 MED ORDER — LIDOCAINE 5 % EX PTCH: 1.0000 | MEDICATED_PATCH | CUTANEOUS | 0 refills | Status: DC

## 2023-03-12 MED ORDER — CYCLOBENZAPRINE HCL 5 MG PO TABS
5.0000 mg | ORAL_TABLET | Freq: Three times a day (TID) | ORAL | 0 refills | Status: DC | PRN
Start: 2023-03-12 — End: 2023-04-18

## 2023-03-12 MED ORDER — ACETAMINOPHEN 160 MG/5ML PO SOLN
650.0000 mg | Freq: Once | ORAL | Status: AC
  Administered 2023-03-12: 650 mg via ORAL
  Filled 2023-03-12: qty 20.3

## 2023-03-12 MED ORDER — LIDOCAINE 5 % EX PTCH
1.0000 | MEDICATED_PATCH | CUTANEOUS | Status: DC
  Administered 2023-03-12: 1 via TRANSDERMAL
  Filled 2023-03-12: qty 1

## 2023-03-12 MED ORDER — NIFEDIPINE 10 MG PO CAPS
10.0000 mg | ORAL_CAPSULE | ORAL | Status: DC | PRN
  Administered 2023-03-12 (×2): 10 mg via ORAL
  Filled 2023-03-12 (×2): qty 1

## 2023-03-12 NOTE — MAU Provider Note (Signed)
Chief Complaint:  Back Pain and Constipation   Event Date/Time   First Provider Initiated Contact with Patient 03/12/23 0037      HPI: Barbara Montgomery is a 22 y.o. G2P1001 at [redacted]w[redacted]d by LMP who presents to maternity admissions reporting an area of her upper back with burning/tingling pain 10/10 and onset of painful contractions 3 days ago, worsening today.  There is also some dysuria with hx UTI in this pregnancy.   She reports good fetal movement, denies LOF, vaginal bleeding.   HPI  Past Medical History: Past Medical History:  Diagnosis Date   Anemia    Chorioamnionitis 04/06/2021   Supervision of high risk pregnancy, antepartum 04/06/2021   Vaginal delivery 04/06/2021    Past obstetric history: OB History  Gravida Para Term Preterm AB Living  2 1 1     1   SAB IAB Ectopic Multiple Live Births        0 1    # Outcome Date GA Lbr Len/2nd Weight Sex Type Anes PTL Lv  2 Current           1 Term 04/04/21 [redacted]w[redacted]d 07:14 / 00:45 3005 g F Vag-Spont EPI  LIV    Past Surgical History: Past Surgical History:  Procedure Laterality Date   APPENDECTOMY      Family History: Family History  Problem Relation Age of Onset   Diabetes Neg Hx    Hypertension Neg Hx    Asthma Neg Hx    Cancer Neg Hx    Heart disease Neg Hx     Social History: Social History   Tobacco Use   Smoking status: Never   Smokeless tobacco: Never  Vaping Use   Vaping status: Never Used  Substance Use Topics   Alcohol use: Never   Drug use: Never    Allergies: No Known Allergies  Meds:  No medications prior to admission.    ROS:  Review of Systems  Constitutional:  Negative for chills, fatigue and fever.  Eyes:  Negative for visual disturbance.  Respiratory:  Negative for shortness of breath.   Cardiovascular:  Negative for chest pain.  Gastrointestinal:  Positive for abdominal pain. Negative for nausea and vomiting.  Genitourinary:  Positive for dysuria. Negative for difficulty  urinating, flank pain, pelvic pain, vaginal bleeding, vaginal discharge and vaginal pain.  Musculoskeletal:  Positive for neck pain.  Neurological:  Negative for dizziness and headaches.  Psychiatric/Behavioral: Negative.       I have reviewed patient's Past Medical Hx, Surgical Hx, Family Hx, Social Hx, medications and allergies.   Physical Exam  Patient Vitals for the past 24 hrs:  BP Temp Temp src Pulse Resp SpO2 Height Weight  03/12/23 0350 101/61 -- -- 89 17 97 % -- --  03/12/23 0200 -- -- -- -- -- 99 % -- --  03/12/23 0000 -- -- -- -- -- 97 % -- --  03/11/23 2331 106/62 -- -- 80 -- 97 % -- --  03/11/23 2326 105/64 97.9 F (36.6 C) Oral 69 15 98 % -- --  03/11/23 2315 -- -- -- -- -- -- 1' 11.62" (0.6 m) 53.7 kg   Constitutional: Well-developed, well-nourished female in no acute distress.  Cardiovascular: normal rate Respiratory: normal effort GI: Abd soft, non-tender, gravid appropriate for gestational age.  MS: Extremities nontender, no edema, normal ROM Neurologic: Alert and oriented x 4.  GU: Neg CVAT.  PELVIC EXAM:   Dilation: 1 Effacement (%): 50 Station: Ballotable Exam by:: [employer],  CNM  FHT:  Baseline 125 , moderate variability, accelerations present, no decelerations Contractions: irregular, every 5-20 minutes   Labs: Results for orders placed or performed during the hospital encounter of 03/11/23 (from the past 24 hour(s))  Urinalysis, Routine w reflex microscopic -Urine, Clean Catch     Status: Abnormal   Collection Time: 03/11/23 11:15 PM  Result Value Ref Range   Color, Urine YELLOW YELLOW   APPearance CLEAR CLEAR   Specific Gravity, Urine 1.024 1.005 - 1.030   pH 6.0 5.0 - 8.0   Glucose, UA 50 (A) NEGATIVE mg/dL   Hgb urine dipstick NEGATIVE NEGATIVE   Bilirubin Urine NEGATIVE NEGATIVE   Ketones, ur NEGATIVE NEGATIVE mg/dL   Protein, ur NEGATIVE NEGATIVE mg/dL   Nitrite NEGATIVE NEGATIVE   Leukocytes,Ua NEGATIVE NEGATIVE       Imaging:  No results found.  MAU Course/MDM: Orders Placed This Encounter  Procedures   Culture, OB Urine   Urinalysis, Routine w reflex microscopic -Urine, Clean Catch   Discharge patient    Meds ordered this encounter  Medications   DISCONTD: NIFEdipine (PROCARDIA) capsule 10 mg   DISCONTD: lidocaine (LIDODERM) 5 % 1 patch   acetaminophen (TYLENOL) 160 MG/5ML solution 650 mg   cyclobenzaprine (FLEXERIL) tablet 5 mg   cyclobenzaprine (FLEXERIL) 5 MG tablet    Sig: Take 1 tablet (5 mg total) by mouth 3 (three) times daily as needed for muscle spasms.    Dispense:  20 tablet    Refill:  0    Order Specific Question:   Supervising Provider    Answer:   Lennart Pall [id number]   lidocaine (LIDODERM) 5 %    Sig: Place 1 patch onto the skin daily. Remove & Discard patch within 12 hours or as directed by MD    Dispense:  5 patch    Refill:  0    Order Specific Question:   Supervising Provider    Answer:   Lennart Pall [[id number]]     NST reviewed and reactive Cervix 1 cm with prior vaginal delivery Procardia 3 doses ordered, 2 doses given with improvement in frequency and intensity of contractions per pt, recheck discussed but not performed with shared decision making No evidence of active preterm labor Neck pain localized, without radiation or neurological deficits.   Tx as MSK pain, Flexeril and lidocaine patch given in MAU with significant reduction in pain Rx for Flexeril, discussed lidocaine OTC options UA wnl, sent for culture Return precautions reviewed   Assessment: 1. Neck pain, acute   2. Abdominal pain during pregnancy in third trimester   3. [redacted] weeks gestation of pregnancy   4. Dysuria during pregnancy, third trimester     Plan: Discharge home Labor precautions and fetal kick counts  Follow-up Information     Department, [facility] Follow up.   Why: As scheduled Contact information: [address] [place]  [address] [phone]         [facility] Follow up.   Specialty: Obstetrics and Gynecology Why: As needed for emergencies Contact information: [address] [place] [address] [phone]               Allergies as of 03/12/2023   No Known Allergies      Medication List     TAKE these medications    aspirin EC 81 MG tablet Take 81 mg by mouth daily. Swallow whole.   cyclobenzaprine 5 MG tablet  Commonly known as: FLEXERIL Take 1 tablet (5 mg total) by mouth 3 (three) times daily as needed for muscle spasms.   ferrous sulfate 325 (65 FE) MG tablet Take 1 tablet (325 mg total) by mouth every other day.   lidocaine 5 % Commonly known as: LIDODERM Place 1 patch onto the skin daily. Remove & Discard patch within 12 hours or as directed by MD Start taking on: March 13, 2023   prenatal multivitamin Tabs tablet Take 1 tablet by mouth daily at 12 noon.        Sharen Counter Certified Nurse-Midwife 03/12/2023 2:55 PM

## 2023-03-13 LAB — CULTURE, OB URINE: Culture: NO GROWTH

## 2023-03-26 ENCOUNTER — Encounter (HOSPITAL_COMMUNITY): Payer: Self-pay | Admitting: Obstetrics and Gynecology

## 2023-03-26 ENCOUNTER — Other Ambulatory Visit: Payer: Self-pay

## 2023-03-26 ENCOUNTER — Inpatient Hospital Stay (HOSPITAL_COMMUNITY)
Admission: AD | Admit: 2023-03-26 | Discharge: 2023-03-27 | Disposition: A | Discharge status: Home / Self Care | Payer: Medicaid Other | Attending: Obstetrics and Gynecology | Admitting: Obstetrics and Gynecology

## 2023-03-26 DIAGNOSIS — Z3A37 37 weeks gestation of pregnancy: Secondary | ICD-10-CM | POA: Diagnosis not present

## 2023-03-26 DIAGNOSIS — Z603 Acculturation difficulty: Secondary | ICD-10-CM

## 2023-03-26 DIAGNOSIS — O99713 Diseases of the skin and subcutaneous tissue complicating pregnancy, third trimester: Secondary | ICD-10-CM | POA: Diagnosis present

## 2023-03-26 DIAGNOSIS — O26643 Intrahepatic cholestasis of pregnancy, third trimester: Secondary | ICD-10-CM | POA: Diagnosis present

## 2023-03-26 DIAGNOSIS — L299 Pruritus, unspecified: Secondary | ICD-10-CM | POA: Diagnosis not present

## 2023-03-26 LAB — URINALYSIS, ROUTINE W REFLEX MICROSCOPIC
Bilirubin Urine: NEGATIVE
Glucose, UA: NEGATIVE mg/dL
Hgb urine dipstick: NEGATIVE
Ketones, ur: NEGATIVE mg/dL
Nitrite: NEGATIVE
Protein, ur: NEGATIVE mg/dL
Specific Gravity, Urine: 1.005 (ref 1.005–1.030)
WBC, UA: >50 WBC/hpf (ref 0–5)
pH: 7 (ref 5.0–8.0)

## 2023-03-26 LAB — CBC
HCT: 32.7 % — ABNORMAL LOW (ref 36.0–46.0)
Hemoglobin: 9.8 g/dL — ABNORMAL LOW (ref 12.0–15.0)
MCH: 22.7 pg — ABNORMAL LOW (ref 26.0–34.0)
MCHC: 30 g/dL (ref 30.0–36.0)
MCV: 75.9 fL — ABNORMAL LOW (ref 80.0–100.0)
Platelets: 272 10*3/uL (ref 150–400)
RBC: 4.31 MIL/uL (ref 3.87–5.11)
RDW: 14.2 % (ref 11.5–15.5)
WBC: 7.1 10*3/uL (ref 4.0–10.5)
nRBC: 0.3 % — ABNORMAL HIGH (ref 0.0–0.2)

## 2023-03-26 MED ORDER — URSODIOL 300 MG PO CAPS
300.0000 mg | ORAL_CAPSULE | Freq: Once | ORAL | Status: AC
  Administered 2023-03-27: 300 mg via ORAL
  Filled 2023-03-26: qty 1

## 2023-03-26 NOTE — MAU Note (Signed)
.  Barbara Montgomery is a 22 y.o. at [redacted]w[redacted]d here in MAU reporting: Friday night started having itching in her feet and hands - now is all over. Also reporting ctx that have been sporadic. Denies VB or LOF. +FM. Hx of cholestasis in previous pregnancy - has not had dx this pregnancy.   Onset of complaint: Friday night  Pain score: 5 Vitals:   03/26/23 2202  BP: (!) 107/54  Pulse: 80  Resp: 19  Temp: 98 F (36.7 C)  SpO2: 98%     FHT:115 Lab orders placed from triage:  UA

## 2023-03-26 NOTE — MAU Provider Note (Signed)
History     CSN: [id number]  Arrival date and time: 03/26/23 2140   Event Date/Time   First Provider Initiated Contact with Patient 03/26/23 2322      Chief Complaint  Patient presents with   Pruritis   Contractions   HPI [name] is a 22 y.o. year old G42P1001 female at [redacted]w[redacted]d weeks gestation who presents to MAU reporting she started having itching on hands and feet since 03/24/2023; "now all over." She also reports "sporadic contractions." She denies VB or LOF. She reports (+) FM. She has a h/o cholestasis of pregnancy with 1st pregnancy. She has not been tested/diagnosis in this pregnancy. She rates her pain 5/10 at this time. She receives [facility]; next appt is 03/30/2023. Her partner is present and contributing to the history taking.      OB History     Gravida  2   Para  1   Term  1   Preterm      AB      Living  1      SAB      IAB      Ectopic      Multiple  0   Live Births  1           Past Medical History:  Diagnosis Date   Anemia    Chorioamnionitis 04/06/2021   Supervision of high risk pregnancy, antepartum 04/06/2021   Vaginal delivery 04/06/2021    Past Surgical History:  Procedure Laterality Date   APPENDECTOMY      Family History  Problem Relation Age of Onset   Diabetes Neg Hx    Hypertension Neg Hx    Asthma Neg Hx    Cancer Neg Hx    Heart disease Neg Hx     Social History   Tobacco Use   Smoking status: Never   Smokeless tobacco: Never  Vaping Use   Vaping status: Never Used  Substance Use Topics   Alcohol use: Never   Drug use: Never    Allergies: No Known Allergies  Medications Prior to Admission  Medication Sig Dispense Refill Last Dose   aspirin EC 81 MG tablet Take 81 mg by mouth daily. Swallow whole.   03/25/2023   cyclobenzaprine (FLEXERIL) 5 MG tablet Take 1 tablet (5 mg total) by mouth 3 (three) times daily as needed for muscle spasms. 20 tablet 0  03/25/2023   ferrous sulfate 325 (65 FE) MG tablet Take 1 tablet (325 mg total) by mouth every other day. 30 tablet 0 03/25/2023   Prenatal Vit-Fe Fumarate-FA (PRENATAL MULTIVITAMIN) TABS tablet Take 1 tablet by mouth daily at 12 noon.   03/25/2023   lidocaine (LIDODERM) 5 % Place 1 patch onto the skin daily. Remove & Discard patch within 12 hours or as directed by MD 5 patch 0     Review of Systems  Constitutional: Negative.   HENT: Negative.    Eyes: Negative.   Respiratory: Negative.    Cardiovascular: Negative.   Gastrointestinal: Negative.   Endocrine: Negative.   Genitourinary:  Positive for pelvic pain (feeling mild, irregular UCs).       (+) FM  Musculoskeletal: Negative.   Skin:        Itching all over  Allergic/Immunologic: Negative.   Neurological: Negative.   Hematological: Negative.   Psychiatric/Behavioral: Negative.     Physical Exam   Blood pressure 108/66, pulse 83, temperature 98 F (36.7 C), temperature source  Oral, resp. rate 16, height 4' 10.5" (1.486 m), weight 54.3 kg, last menstrual period 07/10/2022, SpO2 100%, unknown if currently breastfeeding.  Physical Exam Vitals and nursing note reviewed.  Constitutional:      Appearance: Normal appearance. She is normal weight.  Cardiovascular:     Rate and Rhythm: Normal rate.  Pulmonary:     Effort: Pulmonary effort is normal.  Abdominal:     Palpations: Abdomen is soft.  Musculoskeletal:        General: Normal range of motion.  Skin:    General: Skin is warm and dry.  Neurological:     Mental Status: She is alert and oriented to person, place, and time.  Psychiatric:        Mood and Affect: Mood normal.        Behavior: Behavior normal.        Thought Content: Thought content normal.        Judgment: Judgment normal.    REACTIVE NST - FHR: 130 bpm / moderate variability / accels present / decels absent / TOCO: irregular every 3-8 mins  MAU Course  Procedures  MDM CCUA UCx -- Results pending   CBC CMP Bile Acids -- Results pending   Results for orders placed or performed during the hospital encounter of 03/26/23 (from the past 24 hour(s))  Urinalysis, Routine w reflex microscopic -Urine, Clean Catch     Status: Abnormal   Collection Time: 03/26/23 10:09 PM  Result Value Ref Range   Color, Urine YELLOW YELLOW   APPearance HAZY (A) CLEAR   Specific Gravity, Urine 1.005 1.005 - 1.030   pH 7.0 5.0 - 8.0   Glucose, UA NEGATIVE NEGATIVE mg/dL   Hgb urine dipstick NEGATIVE NEGATIVE   Bilirubin Urine NEGATIVE NEGATIVE   Ketones, ur NEGATIVE NEGATIVE mg/dL   Protein, ur NEGATIVE NEGATIVE mg/dL   Nitrite NEGATIVE NEGATIVE   Leukocytes,Ua LARGE (A) NEGATIVE   RBC / HPF 0-5 0 - 5 RBC/hpf   WBC, UA >50 0 - 5 WBC/hpf   Bacteria, UA MANY (A) NONE SEEN   Squamous Epithelial / HPF 11-20 0 - 5 /HPF  CBC     Status: Abnormal   Collection Time: 03/26/23 11:31 PM  Result Value Ref Range   WBC 7.1 4.0 - 10.5 K/uL   RBC 4.31 3.87 - 5.11 MIL/uL   Hemoglobin 9.8 (L) 12.0 - 15.0 g/dL   HCT 16.1 (L) 09.6 - 04.5 %   MCV 75.9 (L) 80.0 - 100.0 fL   MCH 22.7 (L) 26.0 - 34.0 pg   MCHC 30.0 30.0 - 36.0 g/dL   RDW 40.9 81.1 - 91.4 %   Platelets 272 150 - 400 K/uL   nRBC 0.3 (H) 0.0 - 0.2 %  Comprehensive metabolic panel     Status: Abnormal   Collection Time: 03/26/23 11:31 PM  Result Value Ref Range   Sodium 135 135 - 145 mmol/L   Potassium 3.8 3.5 - 5.1 mmol/L   Chloride 107 98 - 111 mmol/L   CO2 20 (L) 22 - 32 mmol/L   Glucose, Bld 93 70 - 99 mg/dL   BUN 7 6 - 20 mg/dL   Creatinine, Ser 7.82 (L) 0.44 - 1.00 mg/dL   Calcium 8.3 (L) 8.9 - 10.3 mg/dL   Total Protein 5.3 (L) 6.5 - 8.1 g/dL   Albumin 2.2 (L) 3.5 - 5.0 g/dL   AST 18 15 - 41 U/L   ALT 12 0 - 44 U/L  Alkaline Phosphatase 159 (H) 38 - 126 U/L   Total Bilirubin 0.5 <1.2 mg/dL   GFR, Estimated >16 >10 mL/min   Anion gap 8 5 - 15    Assessment and Plan  1. Pruritus gravidarum, third trimester Comments: suspected ICP (has  h/o ICP); pending bile acids - Prescription for: Actigall 300 mg po TID - Prescription for: Hydrocortisone 1% lotion apply to affected areas BID prn - Information provided on ICP and FKC   2. Language barrier affecting health care - [employer] Video Spanish Dalphine Handing #[id number] used for entire visit   3. [redacted] weeks gestation of pregnancy   - Discharge patient - Keep scheduled appt with [facility] on 03/30/2023 - Patient verbalized an understanding of the plan of care and agrees.   Raelyn Mora, CNM 03/26/2023, 11:22 PM

## 2023-03-27 DIAGNOSIS — O99713 Diseases of the skin and subcutaneous tissue complicating pregnancy, third trimester: Secondary | ICD-10-CM

## 2023-03-27 DIAGNOSIS — Z3A37 37 weeks gestation of pregnancy: Secondary | ICD-10-CM | POA: Diagnosis not present

## 2023-03-27 DIAGNOSIS — L299 Pruritus, unspecified: Secondary | ICD-10-CM

## 2023-03-27 LAB — COMPREHENSIVE METABOLIC PANEL
ALT: 12 U/L (ref 0–44)
AST: 18 U/L (ref 15–41)
Albumin: 2.2 g/dL — ABNORMAL LOW (ref 3.5–5.0)
Alkaline Phosphatase: 159 U/L — ABNORMAL HIGH (ref 38–126)
Anion gap: 8 (ref 5–15)
BUN: 7 mg/dL (ref 6–20)
CO2: 20 mmol/L — ABNORMAL LOW (ref 22–32)
Calcium: 8.3 mg/dL — ABNORMAL LOW (ref 8.9–10.3)
Chloride: 107 mmol/L (ref 98–111)
Creatinine, Ser: 0.33 mg/dL — ABNORMAL LOW (ref 0.44–1.00)
GFR, Estimated: >60 mL/min (ref >60)
Glucose, Bld: 93 mg/dL (ref 70–99)
Potassium: 3.8 mmol/L (ref 3.5–5.1)
Sodium: 135 mmol/L (ref 135–145)
Total Bilirubin: 0.5 mg/dL (ref <1.2)
Total Protein: 5.3 g/dL — ABNORMAL LOW (ref 6.5–8.1)

## 2023-03-27 MED ORDER — HYDROCORTISONE 1 % EX CREA: TOPICAL_CREAM | CUTANEOUS | 0 refills | Status: AC

## 2023-03-27 MED ORDER — URSODIOL 500 MG PO TABS: 500.0000 mg | ORAL_TABLET | Freq: Three times a day (TID) | ORAL | 0 refills | Status: DC

## 2023-03-27 NOTE — MAU Note (Signed)
Pt reports itching sensation has improved.  Reports contractions are non painful. Cranberry juice and water provided.

## 2023-03-27 NOTE — Discharge Instructions (Signed)
Pick up your prescriptions from the pharmacy and take them as directed.

## 2023-03-28 ENCOUNTER — Encounter: Payer: Self-pay | Admitting: Obstetrics & Gynecology

## 2023-03-28 LAB — CULTURE, OB URINE: Culture: NO GROWTH

## 2023-03-28 LAB — BILE ACIDS, TOTAL: Bile Acids Total: 20.1 umol/L — ABNORMAL HIGH (ref 0.0–10.0)

## 2023-03-29 ENCOUNTER — Telehealth: Payer: Medicaid Other | Admitting: Physician Assistant

## 2023-03-29 ENCOUNTER — Telehealth: Payer: Self-pay | Admitting: Lactation Services

## 2023-03-29 DIAGNOSIS — O26643 Intrahepatic cholestasis of pregnancy, third trimester: Secondary | ICD-10-CM

## 2023-03-29 NOTE — Telephone Encounter (Signed)
Spoke with Dr. Macon Large and she called the [facility] to give update on patient with + Cholestasis and discuss induction. She will follow up with [facility] on 03/30/2023.

## 2023-03-29 NOTE — Patient Instructions (Signed)
  Ruthell Rummage, thank you for joining Piedad Climes, PA-C for today's virtual visit.  While this provider is not your primary care provider (PCP), if your PCP is located in our provider database this encounter information will be shared with them immediately following your visit.   A [facility] MyChart account gives you access to today's visit and all your visits, tests, and labs performed at [facility] " click here if you don't have a [facility] MyChart account or go to [email or web address]  Consent: (Patient) Caryl Pina Garcia-Valdez provided verbal consent for this virtual visit at the beginning of the encounter.  Current Medications:  Current Outpatient Medications:    aspirin EC 81 MG tablet, Take 81 mg by mouth daily. Swallow whole., Disp: , Rfl:    cyclobenzaprine (FLEXERIL) 5 MG tablet, Take 1 tablet (5 mg total) by mouth 3 (three) times daily as needed for muscle spasms., Disp: 20 tablet, Rfl: 0   ferrous sulfate 325 (65 FE) MG tablet, Take 1 tablet (325 mg total) by mouth every other day., Disp: 30 tablet, Rfl: 0   hydrocortisone cream 1 %, Apply to affected area 2 times daily, Disp: 453.6 g, Rfl: 0   lidocaine (LIDODERM) 5 %, Place 1 patch onto the skin daily. Remove & Discard patch within 12 hours or as directed by MD, Disp: 5 patch, Rfl: 0   Prenatal Vit-Fe Fumarate-FA (PRENATAL MULTIVITAMIN) TABS tablet, Take 1 tablet by mouth daily at 12 noon., Disp: , Rfl:    ursodiol (ACTIGALL) 500 MG tablet, Take 1 tablet (500 mg total) by mouth 3 (three) times daily., Disp: 90 tablet, Rfl: 0   Medications ordered in this encounter:  No orders of the defined types were placed in this encounter.    *If you need refills on other medications prior to your next appointment, please contact your pharmacy*  Follow-Up: Call back or seek an in-person evaluation if the symptoms worsen or if the condition fails to improve as anticipated.  [facility] [phone]  Other Instructions  If you have been instructed to have an in-person evaluation today at a local Urgent Care facility, please use the link below. It will take you to a list of all of our available [facility], including address, phone number and hours of operation. Please do not delay care.  [facility]  If you or a family member do not have a primary care provider, use the link below to schedule a visit and establish care. When you choose a [facility] primary care physician or advanced practice provider, you gain a long-term partner in health. Find a Primary Care Provider  Learn more about 's in-office and virtual care options:  - Get Care Now

## 2023-03-29 NOTE — MAU Provider Note (Signed)
Addendum: Dating Correction and Labs Discussion  I was informed of this patient's bile acid levels concerning for cholestasis of pregnancy.  03/26/2023 Bile acids 20.1, normal LFTs.  During her encounter, the wrong EDD was used, patient was actually [redacted]w[redacted]d at that visit.  Patient's EDC is 05/07/23, based on 17 week scan done by MFM and unsure LMP.  I called  [facility], there is no evidence/scan of an earlier scan done on 09/20/22 with EDD of 05/08/23.   EDD updated in the [facility] records.  Problem list updated.  Patient was already started on Ursodiol.  She has an upcoming appointment tomorrow 03/30/23 at [facility], results will be discussed with her at that time.  She will be scheduled for weekly antenatal testing and then delivery will be recommended at 37 weeks.  Discussed this plan of care with the staff and providers at [facility].   Jaynie Collins, MD, FACOG Obstetrician & Gynecologist, [facility] for [employer], [facility]

## 2023-03-29 NOTE — Progress Notes (Signed)
Virtual Visit Consent   Barbara Montgomery, you are scheduled for a virtual visit with a [facility] provider today. Just as with appointments in the office, your consent must be obtained to participate. Your consent will be active for this visit and any virtual visit you may have with one of our providers in the next 365 days. If you have a MyChart account, a copy of this consent can be sent to you electronically.  As this is a virtual visit, video technology does not allow for your provider to perform a traditional examination. This may limit your provider's ability to fully assess your condition. If your provider identifies any concerns that need to be evaluated in person or the need to arrange testing (such as labs, EKG, etc.), we will make arrangements to do so. Although advances in technology are sophisticated, we cannot ensure that it will always work on either your end or our end. If the connection with a video visit is poor, the visit may have to be switched to a telephone visit. With either a video or telephone visit, we are not always able to ensure that we have a secure connection.  By engaging in this virtual visit, you consent to the provision of healthcare and authorize for your insurance to be billed (if applicable) for the services provided during this visit. Depending on your insurance coverage, you may receive a charge related to this service.  I need to obtain your verbal consent now. Are you willing to proceed with your visit today? Barbara Montgomery has provided verbal consent on 03/29/2023 for a virtual visit (video or telephone). Piedad Climes, [place]  Date: 03/29/2023 11:03 AM  Virtual Visit via Video Note   I, Piedad Climes, connected with  Barbara Montgomery  ([id number], 2000-11-05) on 03/29/23 at 10:30 AM EST by a video-enabled telemedicine application and verified that I am speaking with the correct person using two  identifiers.  Location: Patient: Virtual Visit Location Patient: Home Provider: Virtual Visit Location Provider: Home Office Interpretor: [phone].   I discussed the limitations of evaluation and management by telemedicine and the availability of in person appointments. The patient expressed understanding and agreed to proceed.    History of Present Illness: Barbara Montgomery is a 22 y.o. who identifies as a female who was assigned female at birth, and is being seen today for questions about what to do regarding her recent elevated lab work at [facility] visit on 11/10.  HPI: HPI  Problems:  Patient Active Problem List   Diagnosis Date Noted   Cholestasis during pregnancy in third trimester 04/03/2021    Allergies: No Known Allergies Medications:  Current Outpatient Medications:    aspirin EC 81 MG tablet, Take 81 mg by mouth daily. Swallow whole., Disp: , Rfl:    cyclobenzaprine (FLEXERIL) 5 MG tablet, Take 1 tablet (5 mg total) by mouth 3 (three) times daily as needed for muscle spasms., Disp: 20 tablet, Rfl: 0   ferrous sulfate 325 (65 FE) MG tablet, Take 1 tablet (325 mg total) by mouth every other day., Disp: 30 tablet, Rfl: 0   hydrocortisone cream 1 %, Apply to affected area 2 times daily, Disp: 453.6 g, Rfl: 0   lidocaine (LIDODERM) 5 %, Place 1 patch onto the skin daily. Remove & Discard patch within 12 hours or as directed by MD, Disp: 5 patch, Rfl: 0   Prenatal Vit-Fe Fumarate-FA (PRENATAL MULTIVITAMIN) TABS tablet, Take 1 tablet by mouth daily at 12  noon., Disp: , Rfl:    ursodiol (ACTIGALL) 500 MG tablet, Take 1 tablet (500 mg total) by mouth 3 (three) times daily., Disp: 90 tablet, Rfl: 0  Observations/Objective: Patient is well-developed, well-nourished in no acute distress.  Resting comfortably  at home.  Head is normocephalic, atraumatic.  No labored breathing.  Speech is clear and coherent with logical content.  Patient is alert and oriented at baseline.    Assessment and Plan: 1. Cholestasis during pregnancy in third trimester  Per chart review, seems Dr. Macon Large has reviewed results and reached out to [facility] where patient is seen to set up discussion of further management and induction. Relayed this information to patient. Did reach out to [facility] directly who noted Dr. Macon Large would be there around lunchtime to discuss next steps and they would be reaching out directly to patient.   Follow Up Instructions: I discussed the assessment and treatment plan with the patient. The patient was provided an opportunity to ask questions and all were answered. The patient agreed with the plan and demonstrated an understanding of the instructions.  A copy of instructions were sent to the patient via MyChart unless otherwise noted below.   The patient was advised to call back or seek an in-person evaluation if the symptoms worsen or if the condition fails to improve as anticipated.    Piedad Climes, PA-C

## 2023-03-30 ENCOUNTER — Other Ambulatory Visit: Payer: Self-pay | Admitting: Family Medicine

## 2023-03-30 DIAGNOSIS — O26649 Intrahepatic cholestasis of pregnancy, unspecified trimester: Secondary | ICD-10-CM

## 2023-04-05 ENCOUNTER — Other Ambulatory Visit: Payer: Self-pay | Admitting: Family Medicine

## 2023-04-05 DIAGNOSIS — O26649 Intrahepatic cholestasis of pregnancy, unspecified trimester: Secondary | ICD-10-CM

## 2023-04-06 ENCOUNTER — Ambulatory Visit: Payer: Medicaid Other | Admitting: *Deleted

## 2023-04-06 ENCOUNTER — Ambulatory Visit: Payer: Medicaid Other | Attending: Family Medicine

## 2023-04-06 ENCOUNTER — Other Ambulatory Visit: Payer: Self-pay

## 2023-04-06 VITALS — BP 98/52 | HR 75

## 2023-04-06 DIAGNOSIS — O26649 Intrahepatic cholestasis of pregnancy, unspecified trimester: Secondary | ICD-10-CM | POA: Diagnosis present

## 2023-04-06 DIAGNOSIS — O26613 Liver and biliary tract disorders in pregnancy, third trimester: Secondary | ICD-10-CM | POA: Diagnosis not present

## 2023-04-06 DIAGNOSIS — O26643 Intrahepatic cholestasis of pregnancy, third trimester: Secondary | ICD-10-CM

## 2023-04-06 DIAGNOSIS — Z3A35 35 weeks gestation of pregnancy: Secondary | ICD-10-CM | POA: Diagnosis not present

## 2023-04-06 DIAGNOSIS — K831 Obstruction of bile duct: Secondary | ICD-10-CM | POA: Diagnosis not present

## 2023-04-10 ENCOUNTER — Telehealth (HOSPITAL_COMMUNITY): Payer: Self-pay | Admitting: *Deleted

## 2023-04-10 ENCOUNTER — Encounter (HOSPITAL_COMMUNITY): Payer: Self-pay | Admitting: *Deleted

## 2023-04-10 NOTE — Telephone Encounter (Addendum)
Preadmission screen [phone] interpreter number

## 2023-04-15 ENCOUNTER — Other Ambulatory Visit: Payer: Self-pay | Admitting: Advanced Practice Midwife

## 2023-04-16 ENCOUNTER — Other Ambulatory Visit: Payer: Self-pay

## 2023-04-16 ENCOUNTER — Inpatient Hospital Stay (HOSPITAL_COMMUNITY): Payer: Medicaid Other | Admitting: Anesthesiology

## 2023-04-16 ENCOUNTER — Encounter (HOSPITAL_COMMUNITY): Payer: Self-pay | Admitting: Obstetrics and Gynecology

## 2023-04-16 ENCOUNTER — Inpatient Hospital Stay (HOSPITAL_COMMUNITY): Payer: Medicaid Other

## 2023-04-16 ENCOUNTER — Inpatient Hospital Stay (HOSPITAL_COMMUNITY)
Admission: RE | Admit: 2023-04-16 | Discharge: 2023-04-18 | DRG: 806 | Disposition: A | Discharge status: Home / Self Care | Payer: Medicaid Other | Attending: Obstetrics and Gynecology | Admitting: Obstetrics and Gynecology

## 2023-04-16 DIAGNOSIS — O2662 Liver and biliary tract disorders in childbirth: Secondary | ICD-10-CM | POA: Diagnosis not present

## 2023-04-16 DIAGNOSIS — O26649 Intrahepatic cholestasis of pregnancy, unspecified trimester: Principal | ICD-10-CM | POA: Diagnosis present

## 2023-04-16 DIAGNOSIS — Z3A37 37 weeks gestation of pregnancy: Secondary | ICD-10-CM

## 2023-04-16 DIAGNOSIS — Z7982 Long term (current) use of aspirin: Secondary | ICD-10-CM | POA: Diagnosis not present

## 2023-04-16 DIAGNOSIS — E7879 Other disorders of bile acid and cholesterol metabolism: Secondary | ICD-10-CM | POA: Diagnosis present

## 2023-04-16 DIAGNOSIS — O26643 Intrahepatic cholestasis of pregnancy, third trimester: Secondary | ICD-10-CM | POA: Diagnosis present

## 2023-04-16 DIAGNOSIS — D62 Acute posthemorrhagic anemia: Secondary | ICD-10-CM | POA: Diagnosis not present

## 2023-04-16 DIAGNOSIS — O9081 Anemia of the puerperium: Secondary | ICD-10-CM | POA: Diagnosis not present

## 2023-04-16 DIAGNOSIS — K7689 Other specified diseases of liver: Secondary | ICD-10-CM | POA: Diagnosis present

## 2023-04-16 LAB — CBC
HCT: 33.4 % — ABNORMAL LOW (ref 36.0–46.0)
Hemoglobin: 10.1 g/dL — ABNORMAL LOW (ref 12.0–15.0)
MCH: 21.9 pg — ABNORMAL LOW (ref 26.0–34.0)
MCHC: 30.2 g/dL (ref 30.0–36.0)
MCV: 72.5 fL — ABNORMAL LOW (ref 80.0–100.0)
Platelets: 278 10*3/uL (ref 150–400)
RBC: 4.61 MIL/uL (ref 3.87–5.11)
RDW: 15.5 % (ref 11.5–15.5)
WBC: 6.4 10*3/uL (ref 4.0–10.5)
nRBC: 0 % (ref 0.0–0.2)

## 2023-04-16 LAB — TYPE AND SCREEN
ABO/RH(D): O POS
Antibody Screen: NEGATIVE

## 2023-04-16 LAB — RPR: RPR Ser Ql: NONREACTIVE

## 2023-04-16 MED ORDER — MISOPROSTOL 25 MCG QUARTER TABLET
25.0000 ug | ORAL_TABLET | Freq: Once | ORAL | Status: AC
  Administered 2023-04-16: 25 ug via VAGINAL
  Filled 2023-04-16 (×2): qty 1

## 2023-04-16 MED ORDER — FENTANYL-BUPIVACAINE-NACL 0.5-0.125-0.9 MG/250ML-% EP SOLN
12.0000 mL/h | EPIDURAL | Status: DC | PRN
  Administered 2023-04-16: 12 mL/h via EPIDURAL
  Filled 2023-04-16: qty 250

## 2023-04-16 MED ORDER — ONDANSETRON HCL 4 MG PO TABS: 4.0000 mg | ORAL_TABLET | ORAL | Status: DC | PRN

## 2023-04-16 MED ORDER — EPHEDRINE 5 MG/ML INJ
10.0000 mg | INTRAVENOUS | Status: DC | PRN
Start: 2023-04-16 — End: 2023-04-16

## 2023-04-16 MED ORDER — ZOLPIDEM TARTRATE 5 MG PO TABS: 5.0000 mg | ORAL_TABLET | Freq: Every evening | ORAL | Status: DC | PRN

## 2023-04-16 MED ORDER — SODIUM CHLORIDE 0.9 % IV SOLN
5.0000 10*6.[IU] | Freq: Once | INTRAVENOUS | Status: AC
  Administered 2023-04-16: 5 10*6.[IU] via INTRAVENOUS
  Filled 2023-04-16: qty 5

## 2023-04-16 MED ORDER — SODIUM CHLORIDE 0.9 % IV SOLN: 250.0000 mL | INTRAVENOUS | Status: DC | PRN

## 2023-04-16 MED ORDER — ONDANSETRON HCL 4 MG/2ML IJ SOLN: 4.0000 mg | INTRAMUSCULAR | Status: DC | PRN

## 2023-04-16 MED ORDER — SODIUM CHLORIDE 0.9% FLUSH: 3.0000 mL | INTRAVENOUS | Status: DC | PRN

## 2023-04-16 MED ORDER — BENZOCAINE-MENTHOL 20-0.5 % EX AERO
1.0000 | INHALATION_SPRAY | CUTANEOUS | Status: DC | PRN
  Administered 2023-04-16: 1 via TOPICAL
  Filled 2023-04-16: qty 56

## 2023-04-16 MED ORDER — ACETAMINOPHEN 325 MG PO TABS
650.0000 mg | ORAL_TABLET | ORAL | Status: DC | PRN
  Administered 2023-04-17: 650 mg via ORAL
  Filled 2023-04-16: qty 2

## 2023-04-16 MED ORDER — DIBUCAINE (PERIANAL) 1 % EX OINT: 1.0000 | TOPICAL_OINTMENT | CUTANEOUS | Status: DC | PRN

## 2023-04-16 MED ORDER — ONDANSETRON HCL 4 MG/2ML IJ SOLN: 4.0000 mg | Freq: Four times a day (QID) | INTRAMUSCULAR | Status: DC | PRN

## 2023-04-16 MED ORDER — FENTANYL CITRATE (PF) 100 MCG/2ML IJ SOLN: 50.0000 ug | INTRAMUSCULAR | Status: DC | PRN

## 2023-04-16 MED ORDER — OXYCODONE-ACETAMINOPHEN 5-325 MG PO TABS: 2.0000 | ORAL_TABLET | ORAL | Status: DC | PRN

## 2023-04-16 MED ORDER — MEASLES, MUMPS & RUBELLA VAC IJ SOLR: 0.5000 mL | Freq: Once | INTRAMUSCULAR | Status: DC

## 2023-04-16 MED ORDER — OXYTOCIN BOLUS FROM INFUSION
333.0000 mL | Freq: Once | INTRAVENOUS | Status: AC
  Administered 2023-04-16: 333 mL via INTRAVENOUS

## 2023-04-16 MED ORDER — LACTATED RINGERS IV SOLN
500.0000 mL | Freq: Once | INTRAVENOUS | Status: AC
  Administered 2023-04-16: 500 mL via INTRAVENOUS

## 2023-04-16 MED ORDER — OXYTOCIN-SODIUM CHLORIDE 30-0.9 UT/500ML-% IV SOLN: 1.0000 m[IU]/min | INTRAVENOUS | Status: DC

## 2023-04-16 MED ORDER — TERBUTALINE SULFATE 1 MG/ML IJ SOLN: 0.2500 mg | Freq: Once | INTRAMUSCULAR | Status: AC | PRN

## 2023-04-16 MED ORDER — SODIUM CHLORIDE 0.9% FLUSH
3.0000 mL | Freq: Two times a day (BID) | INTRAVENOUS | Status: DC
  Administered 2023-04-16: 3 mL via INTRAVENOUS

## 2023-04-16 MED ORDER — DIPHENHYDRAMINE HCL 25 MG PO CAPS
25.0000 mg | ORAL_CAPSULE | Freq: Four times a day (QID) | ORAL | Status: DC | PRN
  Administered 2023-04-16: 25 mg via ORAL
  Filled 2023-04-16: qty 1

## 2023-04-16 MED ORDER — COCONUT OIL OIL: 1.0000 | TOPICAL_OIL | Status: DC | PRN

## 2023-04-16 MED ORDER — LACTATED RINGERS IV SOLN: 500.0000 mL | INTRAVENOUS | Status: DC | PRN

## 2023-04-16 MED ORDER — SENNOSIDES-DOCUSATE SODIUM 8.6-50 MG PO TABS
2.0000 | ORAL_TABLET | ORAL | Status: DC
  Administered 2023-04-16 – 2023-04-17 (×2): 2 via ORAL
  Filled 2023-04-16 (×2): qty 2

## 2023-04-16 MED ORDER — OXYTOCIN-SODIUM CHLORIDE 30-0.9 UT/500ML-% IV SOLN
2.5000 [IU]/h | INTRAVENOUS | Status: DC
  Filled 2023-04-16: qty 500

## 2023-04-16 MED ORDER — PHENYLEPHRINE 80 MCG/ML (10ML) SYRINGE FOR IV PUSH (FOR BLOOD PRESSURE SUPPORT): 80.0000 ug | PREFILLED_SYRINGE | INTRAVENOUS | Status: DC | PRN

## 2023-04-16 MED ORDER — SIMETHICONE 80 MG PO CHEW: 80.0000 mg | CHEWABLE_TABLET | ORAL | Status: DC | PRN

## 2023-04-16 MED ORDER — LIDOCAINE HCL (PF) 1 % IJ SOLN
INTRAMUSCULAR | Status: DC | PRN
  Administered 2023-04-16: 11 mL via EPIDURAL

## 2023-04-16 MED ORDER — DIPHENHYDRAMINE HCL 50 MG/ML IJ SOLN: 12.5000 mg | INTRAMUSCULAR | Status: DC | PRN

## 2023-04-16 MED ORDER — TETANUS-DIPHTH-ACELL PERTUSSIS 5-2.5-18.5 LF-MCG/0.5 IM SUSY: 0.5000 mL | PREFILLED_SYRINGE | Freq: Once | INTRAMUSCULAR | Status: DC

## 2023-04-16 MED ORDER — MISOPROSTOL 50MCG HALF TABLET
50.0000 ug | ORAL_TABLET | Freq: Once | ORAL | Status: AC
  Administered 2023-04-16: 50 ug via ORAL
  Filled 2023-04-16 (×2): qty 1

## 2023-04-16 MED ORDER — ACETAMINOPHEN 325 MG PO TABS: 650.0000 mg | ORAL_TABLET | ORAL | Status: DC | PRN

## 2023-04-16 MED ORDER — OXYCODONE-ACETAMINOPHEN 5-325 MG PO TABS: 1.0000 | ORAL_TABLET | ORAL | Status: DC | PRN

## 2023-04-16 MED ORDER — LACTATED RINGERS IV SOLN: INTRAVENOUS | Status: DC

## 2023-04-16 MED ORDER — IBUPROFEN 600 MG PO TABS
600.0000 mg | ORAL_TABLET | Freq: Four times a day (QID) | ORAL | Status: DC
  Administered 2023-04-16 – 2023-04-17 (×5): 600 mg via ORAL
  Filled 2023-04-16 (×7): qty 1

## 2023-04-16 MED ORDER — PENICILLIN G POT IN DEXTROSE 60000 UNIT/ML IV SOLN
3.0000 10*6.[IU] | INTRAVENOUS | Status: DC
  Administered 2023-04-16: 3 10*6.[IU] via INTRAVENOUS
  Filled 2023-04-16: qty 50

## 2023-04-16 MED ORDER — LIDOCAINE HCL (PF) 1 % IJ SOLN: 30.0000 mL | INTRAMUSCULAR | Status: DC | PRN

## 2023-04-16 MED ORDER — TERBUTALINE SULFATE 1 MG/ML IJ SOLN
INTRAMUSCULAR | Status: AC
  Administered 2023-04-16: 0.25 mg via SUBCUTANEOUS
  Filled 2023-04-16: qty 1

## 2023-04-16 MED ORDER — PRENATAL MULTIVITAMIN CH
1.0000 | ORAL_TABLET | Freq: Every day | ORAL | Status: DC
  Administered 2023-04-17: 1 via ORAL
  Filled 2023-04-16: qty 1

## 2023-04-16 MED ORDER — SOD CITRATE-CITRIC ACID 500-334 MG/5ML PO SOLN: 30.0000 mL | ORAL | Status: DC | PRN

## 2023-04-16 MED ORDER — WITCH HAZEL-GLYCERIN EX PADS: 1.0000 | MEDICATED_PAD | CUTANEOUS | Status: DC | PRN

## 2023-04-16 NOTE — Anesthesia Preprocedure Evaluation (Signed)
Anesthesia Evaluation  Patient identified by MRN, date of birth, ID band Patient awake    Reviewed: Allergy & Precautions, H&P , NPO status , Patient's Chart, lab work & pertinent test results  Airway Mallampati: II  TM Distance: >3 FB Neck ROM: Full    Dental no notable dental hx.    Pulmonary neg pulmonary ROS   Pulmonary exam normal breath sounds clear to auscultation       Cardiovascular negative cardio ROS Normal cardiovascular exam Rhythm:Regular Rate:Normal     Neuro/Psych negative neurological ROS  negative psych ROS   GI/Hepatic negative GI ROS, Neg liver ROS,,,  Endo/Other  negative endocrine ROS    Renal/GU negative Renal ROS  negative genitourinary   Musculoskeletal negative musculoskeletal ROS (+)    Abdominal   Peds negative pediatric ROS (+)  Hematology negative hematology ROS (+)   Anesthesia Other Findings IOL for cholestasis  Reproductive/Obstetrics (+) Pregnancy                             Anesthesia Physical Anesthesia Plan  ASA: 2  Anesthesia Plan: Epidural   Post-op Pain Management:    Induction:   PONV Risk Score and Plan: Treatment may vary due to age or medical condition  Airway Management Planned: Natural Airway  Additional Equipment:   Intra-op Plan:   Post-operative Plan:   Informed Consent: I have reviewed the patients History and Physical, chart, labs and discussed the procedure including the risks, benefits and alternatives for the proposed anesthesia with the patient or authorized representative who has indicated his/her understanding and acceptance.       Plan Discussed with: Anesthesiologist  Anesthesia Plan Comments: (Patient identified. Risks, benefits, options discussed with patient including but not limited to bleeding, infection, nerve damage, paralysis, failed block, incomplete pain control, headache, blood pressure changes,  nausea, vomiting, reactions to medication, itching, and post partum back pain. Confirmed with bedside nurse the patient's most recent platelet count. Confirmed with the patient that they are not taking any anticoagulation, have any bleeding history or any family history of bleeding disorders. Patient expressed understanding and wishes to proceed. All questions were answered. )        Anesthesia Quick Evaluation

## 2023-04-16 NOTE — Progress Notes (Signed)
Labor Progress Note  Barbara Montgomery is a 22 y.o. G2P1001 at [redacted]w[redacted]d presented for IOL 2/2 Cholestasis   S: report from nursing patient SROM with light mec about an hour and a half after epidural, baby with variables decels  O:  BP 107/67   Pulse 68   Temp 97.7 F (36.5 C) (Oral)   Resp 16   Ht 5' (1.524 m)   Wt 55.8 kg   LMP  (LMP Unknown)   BMI 24.02 kg/m  EFM:120bpm/Moderate variability/ 15x15 accels/ Variable decels CAT: 2 Toco: regular, every 2 minutes   CVE: Dilation: 8 Effacement (%): 90 Station: -1 Presentation: Vertex Exam by:: [name]   [name]: 22 y.o. G2P1001 [redacted]w[redacted]d  here for IOL as above  #Labor: Progressing well. SROM, no cord present on exam.  Placed IUPC for potential Amnioinfusion PRN #Pain: Epidural #FWB: CAT 2 #GBS  pending, early term, PCN  #Cholestasis: Bile acids 21 on 11/10, compliant with Ursodiol 500mg  TID, Dr. Judeth Cornfield recommended weekly NSTs and delivery at 37wks, asymptomatic on admission   [name], MD [employer] Fellow, Faculty practice [facility], Center for [facility] 04/16/23  3:13 PM

## 2023-04-16 NOTE — Progress Notes (Signed)
Labor Progress Note  Barbara Montgomery is a 22 y.o. G2P1001 at [redacted]w[redacted]d presented for IOL 2/2 Cholestasis.   S: previously patient had prolonged decel in s/o tachysystole uterine contractions after double dose of cytotec, resolved with terb x 1 and position changes. Since patient doing well, no concerns.  pt rating ctx 6 or 7 / 10, agreeable with check at this time  O:  BP 117/63   Pulse 89   Temp 98.1 F (36.7 C) (Oral)   Resp 16   Ht 5' (1.524 m)   Wt 55.8 kg   LMP  (LMP Unknown)   BMI 24.02 kg/m  EFM:150bpm/Moderate variability/ 15x15 accels/ None decels CAT: 1 Toco: regular, every 1.5 - 2 minutes   CVE: Dilation: 3 Effacement (%): 50 Station: -2 Presentation: Vertex Exam by:: [name]   [name]: 22 y.o. G2P1001 [redacted]w[redacted]d  here for IOL as above  #Labor: Progressing well. Pt would like epidural prior to AROM. AROM as soon as she's comfortable  #Pain: planning epidural  #FWB: CAT 1 #GBS  pending, early term - PCN  #Cholestasis: Bile acids 21 on 11/10, compliant with Ursodiol 500mg  TID, Dr. Judeth Cornfield recommended weekly NSTs and delivery at 37wks, asymptomatic on admission   [name], MD [facility] Fellow, Faculty practice [facility], Center for [facility] Healthcare 04/16/23  1:09 PM

## 2023-04-16 NOTE — Lactation Note (Signed)
This note was copied from a baby's chart. Lactation Consultation Note  Patient Name: Barbara Montgomery [id number] Date: 04/16/2023 Age:22 hours Reason for consult: Initial assessment;Early term 37-38.6wks Used hospital interpreter for consult. Mom's feeding choice is BF/formula feeding.  Mom states she is putting the baby to the breast before giving formula. Praised mom for that and suggested to continue w/that plan. Discussed how when mom feels her breast filling she may no long have to give formula. Newborn feeding habits, STS, I&O reviewed. Mom encouraged to feed baby 8-12 times/24 hours and with feeding cues.  Experienced BF mom states she has no questions or concerns at this time. Encouraged if she does to ask. Mom stated she is good. Maternal Data Does the patient have breastfeeding experience prior to this delivery?: Yes How long did the patient breastfeed?: 18 months to her now 22 yr old. Stopped BF 6 months ago.  Feeding Mother's Current Feeding Choice: Breast Milk and Formula Nipple Type: Slow - flow  LATCH Score                    Lactation Tools Discussed/Used    Interventions Interventions: Breast feeding basics reviewed;Education;LC Services brochure  Discharge    Consult Status Consult Status: Complete Date: 04/16/23    Charyl Dancer 04/16/2023, 8:59 PM

## 2023-04-16 NOTE — Anesthesia Procedure Notes (Signed)
Epidural Patient location during procedure: OB Start time: 04/16/2023 1:26 PM End time: 04/16/2023 1:41 PM  Staffing Anesthesiologist: Lowella Curb, MD Performed: anesthesiologist   Preanesthetic Checklist Completed: patient identified, IV checked, site marked, risks and benefits discussed, surgical consent, monitors and equipment checked, pre-op evaluation and timeout performed  Epidural Patient position: sitting Prep: ChloraPrep Patient monitoring: heart rate, cardiac monitor, continuous pulse ox and blood pressure Approach: midline Location: L2-L3 Injection technique: LOR saline  Needle:  Needle type: Tuohy  Needle gauge: 17 G Needle length: 9 cm Needle insertion depth: 5 cm Catheter type: closed end flexible Catheter size: 20 Guage Catheter at skin depth: 9 cm Test dose: negative  Assessment Events: blood not aspirated, injection not painful, no injection resistance, no paresthesia and negative IV test  Additional Notes Reason for block:procedure for pain

## 2023-04-16 NOTE — H&P (Signed)
OBSTETRIC ADMISSION HISTORY AND PHYSICAL  Barbara Montgomery is a 22 y.o. female G2P1001 with IUP at [redacted]w[redacted]d by 17 week [place] presenting for IOL for Cholestasis. She reports +FMs, No LOF, no VB, no blurry vision, headaches or peripheral edema, and RUQ pain.  She plans on breast feeding. She unsure for birth control. 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   History of cholestasis during pregnancy    Supervision of high risk pregnancy, antepartum 04/06/2021   Vaginal delivery 04/06/2021    Past Surgical History: Past Surgical History:  Procedure Laterality Date   APPENDECTOMY      Obstetrical History: OB History     Gravida  2   Para  1   Term  1   Preterm      AB      Living  1      SAB      IAB      Ectopic      Multiple  0   Live Births  1           Social History Social History   Socioeconomic History   Marital status: Single    Spouse name: Not on file   Number of children: Not on file   Years of education: Not on file   Highest education level: Not on file  Occupational History   Not on file  Tobacco Use   Smoking status: Never   Smokeless tobacco: Never  Vaping Use   Vaping status: Never Used  Substance and Sexual Activity   Alcohol use: Never   Drug use: Never   Sexual activity: Not Currently  Other Topics Concern   Not on file  Social History Narrative   Not on file   Social Determinants of Health   Financial Resource Strain: Not on file  Food Insecurity: No Food Insecurity (04/16/2023)   Hunger Vital Sign    Worried About Running Out of Food in the Last  Year: Never true    Ran Out of Food in the Last Year: Never true  Transportation Needs: No Transportation Needs (04/16/2023)   PRAPARE - Administrator, Civil Service (Medical): No    Lack of Transportation (Non-Medical): No  Physical Activity: Not on file  Stress: Not on file  Social Connections: Not on file    Family History: Family History  Problem Relation Age of Onset   Diabetes Neg Hx    Hypertension Neg Hx    Asthma Neg Hx    Cancer Neg Hx    Heart disease Neg Hx     Allergies: No Known Allergies  Medications Prior to Admission  Medication Sig Dispense Refill Last Dose   aspirin EC 81 MG tablet Take 81 mg by mouth daily. Swallow whole.   04/15/2023   hydrocortisone cream 1 % Apply to affected area 2 times daily 453.6 g 0 04/15/2023   Prenatal Vit-Fe Fumarate-FA (PRENATAL MULTIVITAMIN) TABS tablet Take 1 tablet by mouth daily at 12 noon.   04/15/2023   cyclobenzaprine (FLEXERIL) 5 MG tablet Take 1 tablet (5 mg total) by  mouth 3 (three) times daily as needed for muscle spasms. 20 tablet 0 More than a month   ferrous sulfate 325 (65 FE) MG tablet Take 1 tablet (325 mg total) by mouth every other day. 30 tablet 0    lidocaine (LIDODERM) 5 % Place 1 patch onto the skin daily. Remove & Discard patch within 12 hours or as directed by MD 5 patch 0 More than a month   ursodiol (ACTIGALL) 500 MG tablet Take 1 tablet (500 mg total) by mouth 3 (three) times daily. 90 tablet 0 Unknown     Review of Systems   All systems reviewed and negative except as stated in HPI  Blood pressure 107/64, pulse 87, temperature 98.1 F (36.7 C), temperature source Oral, resp. rate 17, height 5' (1.524 m), weight 55.8 kg, unknown if currently breastfeeding. General appearance: alert, cooperative, appears stated age, and no distress Lungs: clear to auscultation bilaterally Heart: regular rate and rhythm Abdomen: soft, non-tender; bowel sounds normal Pelvic: adequate; proven to  3005g Extremities: Homans sign is negative, no sign of DVT Presentation: cephalic Fetal monitoringBaseline: 135 bpm, Variability: Good {> 6 bpm), Accelerations: Reactive, and Decelerations: Absent Uterine activityFrequency: Every 7 minutes Dilation: 1 Effacement (%): Thick Station: -3 Exam by:: Camelia Eng, RN Prenatal labs: ABO, Rh: --/--/PENDING (12/01 8295) Antibody: PENDING (12/01 0724) Rubella: Immune (06/27 0000) RPR: Nonreactive (06/27 0000)  HBsAg: Negative (06/27 0000)  HIV: Non-reactive (06/27 0000)  GBS:   unknown; collected on admission 1 hr Glucola 103; passed Genetic screening  MSAFP negative Anatomy US normal  Prenatal Transfer Tool  Maternal Diabetes: No Genetic Screening: Normal Maternal Ultrasounds/Referrals: Normal Fetal Ultrasounds or other Referrals:  None Maternal Substance Abuse:  No Significant Maternal Medications:  None Significant Maternal Lab Results:  Other: Elevated bile acids, Unknown GBS Number of Prenatal Visits:greater than 3 verified prenatal visits Other Comments:  None  Results for orders placed or performed during the hospital encounter of 04/16/23 (from the past 24 hour(s))  CBC   Collection Time: 04/16/23  7:22 AM  Result Value Ref Range   WBC 6.4 4.0 - 10.5 K/uL   RBC 4.61 3.87 - 5.11 MIL/uL   Hemoglobin 10.1 (L) 12.0 - 15.0 g/dL   HCT 62.1 (L) 30.8 - 65.7 %   MCV 72.5 (L) 80.0 - 100.0 fL   MCH 21.9 (L) 26.0 - 34.0 pg   MCHC 30.2 30.0 - 36.0 g/dL   RDW 84.6 96.2 - 95.2 %   Platelets 278 150 - 400 K/uL   nRBC 0.0 0.0 - 0.2 %  Type and screen [facility]   Collection Time: 04/16/23  7:24 AM  Result Value Ref Range   ABO/RH(D) PENDING    Antibody Screen PENDING    Sample Expiration      04/19/2023,2359 Performed at [facility], [address] [place], [place] [address]     Patient Active Problem List   Diagnosis Date Noted   Cholestasis during pregnancy 04/16/2023   Cholestasis during pregnancy in  third trimester 04/03/2021    Assessment/Plan:  Barbara Montgomery is a 22 y.o. G2P1001 at [redacted]w[redacted]d here for IOL for cholestasis  #Labor: #Pain: Per patient request  #FWB: Cat I #ID:  GBS unknown; early term - PCN #MOF: breast #MOC: unsure, ?condoms   #Cholestasis: Bile acids 21 on 11/10, compliant with Ursodiol 500mg  TID, Dr. Judeth Cornfield recommended weekly NSTs and delivery at 37wks, asymptomatic on admission   [name], MD  04/16/2023, 8:19  AM

## 2023-04-16 NOTE — Discharge Summary (Shared)
Postpartum Discharge Summary  Date of Service updated***     Patient Name: Barbara Montgomery DOB: 01-26-2001 MRN: [id number]  Date of admission: 04/16/2023 Delivery date:04/16/2023 Delivering provider: CHUBB, CASEY C Date of discharge: 04/16/2023  Admitting diagnosis: Cholestasis during pregnancy [O26.649] Intrauterine pregnancy: [redacted]w[redacted]d     Secondary diagnosis:  Principal Problem:   Cholestasis during pregnancy  Additional problems: ***    Discharge diagnosis: Term Pregnancy Delivered and Cholestasis                                                Post partum procedures:{Postpartum procedures:23558} Augmentation: Cytotec Complications: None  Hospital course: Induction of Labor With Vaginal Delivery   22 y.o. yo G2P1001 at [redacted]w[redacted]d was admitted to the hospital 04/16/2023 for induction of labor.  Indication for induction: Cholestasis of pregnancy.  Patient had an labor course that was complicated by one prolonged deceleration after getting dual cytotec and thusly tachysystole but resolved with Terb x 1.  When patient SROM there was noted to be moderate meconium as well. Otherwise uncomplicated.  Membrane Rupture Time/Date: 2:49 PM,04/16/2023  Delivery Method:Vaginal, Spontaneous Operative Delivery:N/A Episiotomy: None Lacerations:    Details of delivery can be found in separate delivery note.  Patient had a postpartum course complicated by***. Patient is discharged home 04/16/23.  Newborn Data: Birth date:04/16/2023 Birth time:3:39 PM Gender:Female Living status:Living Apgars:9 ,9  Weight:   Magnesium Sulfate received: {Mag received:30440022} BMZ received: No Rhophylac:N/A MMR:{MMR:30440033} T-DaP:{Tdap:23962} Flu: {WUJ:81191} RSV Vaccine received: {RSV:31013} Transfusion:{Transfusion received:30440034}  Immunizations received: There is no immunization history for the selected administration types on file for this patient.  Physical exam  Vitals:    04/16/23 1501 04/16/23 1535 04/16/23 1552 04/16/23 1601  BP: 122/80 122/65 108/72 114/65  Pulse: 79 96 82 77  Resp: 16 16 14 16   Temp:      TempSrc:      Weight:      Height:       General: {Exam; general:21111117} Lochia: {Desc; appropriate/inappropriate:30686::"appropriate"} Uterine Fundus: {Desc; firm/soft:30687} Incision: {Exam; incision:21111123} DVT Evaluation: {Exam; dvt:2111122} Labs: Lab Results  Component Value Date   WBC 6.4 04/16/2023   HGB 10.1 (L) 04/16/2023   HCT 33.4 (L) 04/16/2023   MCV 72.5 (L) 04/16/2023   PLT 278 04/16/2023      Latest Ref Rng & Units 03/26/2023   11:31 PM  CMP  Glucose 70 - 99 mg/dL 93   BUN 6 - 20 mg/dL 7   Creatinine 4.78 - 2.95 mg/dL 6.21   Sodium 308 - 657 mmol/L 135   Potassium 3.5 - 5.1 mmol/L 3.8   Chloride 98 - 111 mmol/L 107   CO2 22 - 32 mmol/L 20   Calcium 8.9 - 10.3 mg/dL 8.3   Total Protein 6.5 - 8.1 g/dL 5.3   Total Bilirubin <8.4 mg/dL 0.5   Alkaline Phos 38 - 126 U/L 159   AST 15 - 41 U/L 18   ALT 0 - 44 U/L 12    Edinburgh Score:    04/17/2021   12:30 PM  Edinburgh Postnatal Depression Scale Screening Tool  I have been able to laugh and see the funny side of things. 0  I have looked forward with enjoyment to things. 0  I have blamed myself unnecessarily when things went wrong. 0  I have been anxious or worried  for no good reason. 0  I have felt scared or panicky for no good reason. 0  Things have been getting on top of me. 1  I have been so unhappy that I have had difficulty sleeping. 0  I have felt sad or miserable. 0  I have been so unhappy that I have been crying. 0  The thought of harming myself has occurred to me. 0  Edinburgh Postnatal Depression Scale Total 1   No data recorded  After visit meds:  Allergies as of 04/16/2023   No Known Allergies   Med Rec must be completed prior to using this [facility]***        Discharge home in stable condition Infant Feeding: {Baby  feeding:23562} Infant Disposition:{CHL IP OB HOME WITH WUJWJX:91478} Discharge instruction: per After Visit Summary and Postpartum booklet. Activity: Advance as tolerated. Pelvic rest for 6 weeks.  Diet: {OB GNFA:21308657} Future Appointments:No future appointments. Follow up Visit: Message to [facility] 12/1  Please schedule this patient for a In person postpartum visit in 4 weeks with the following provider: Any provider. Additional Postpartum F/U: n/a   Low risk pregnancy complicated by:  Cholestasis Delivery mode:  Vaginal, Spontaneous Anticipated Birth Control:  Unsure   04/16/2023 [name], MD

## 2023-04-17 LAB — CBC
HCT: 30.7 % — ABNORMAL LOW (ref 36.0–46.0)
Hemoglobin: 9.1 g/dL — ABNORMAL LOW (ref 12.0–15.0)
MCH: 21.6 pg — ABNORMAL LOW (ref 26.0–34.0)
MCHC: 29.6 g/dL — ABNORMAL LOW (ref 30.0–36.0)
MCV: 72.9 fL — ABNORMAL LOW (ref 80.0–100.0)
Platelets: 253 10*3/uL (ref 150–400)
RBC: 4.21 MIL/uL (ref 3.87–5.11)
RDW: 15.6 % — ABNORMAL HIGH (ref 11.5–15.5)
WBC: 8 10*3/uL (ref 4.0–10.5)
nRBC: 0 % (ref 0.0–0.2)

## 2023-04-17 MED ORDER — ACETAMINOPHEN 325 MG PO TABS: 650.0000 mg | ORAL_TABLET | ORAL | 0 refills | Status: AC | PRN

## 2023-04-17 MED ORDER — SENNOSIDES-DOCUSATE SODIUM 8.6-50 MG PO TABS: 2.0000 | ORAL_TABLET | Freq: Every evening | ORAL | 0 refills | Status: DC | PRN

## 2023-04-17 MED ORDER — IBUPROFEN 600 MG PO TABS: 600.0000 mg | ORAL_TABLET | Freq: Four times a day (QID) | ORAL | 0 refills | Status: DC | PRN

## 2023-04-17 NOTE — Progress Notes (Signed)
Pt was provided paperback education book vs BabyScript App per pt.  [facility] Spanish Interpreter: Raquel present for shift assessment and answering pt questions.

## 2023-04-17 NOTE — Progress Notes (Signed)
RN spoke with pt using Spanish Interpreter: Barbara Montgomery [phone] about infant staying inpatient over night per Nursery NP request.  Pt stated she would like to stay admitted overnight and dc tomorrow.  Updated [name] with faculty practice about canceling MOB discharge.

## 2023-04-17 NOTE — Lactation Note (Addendum)
This note was copied from a baby's chart. Lactation Consultation Note  Patient Name: Barbara Montgomery [id number] Date: 04/17/2023 Age:22 years Reason for consult: Follow-up assessment;Difficult latch In house Spanish Interpreter used # Maria Per MOB, she really wants to BF infant, but infant is very sleepy today and  not latching well at the breast, infant may BF only 3 or maybe 5 minutes, MOB is supplementing infant with formula after each feeding, infant recently consume 15 minutes but with difficulty per MOB. LC gave MOB White Nfant nipple to try at next feeding and set MOB up with DEBP to help stimulate and establish her milk supply. MOB knows to pump every 3 hours for 15 minutes on initial setting. Infant may be acting more like LPTI due being born at [redacted] weeks gestation.   Current feeding plan: 1- MOB will BF infant every 3 hours, attempt to latch 1st fore every feeding after 3-5 minutes if infant does not latch, to stop and supplement infant with any EBM first that was pumped and then formula.  2- MOB on Day 2 will offer infant 21 mls or more if infant wants it for each feeding. 3- MOB will continue to use DEBP every 3 hours for 15 minutes on initial setting. MOB knows that her EBM is safe for 4 hours at room temperature whereas formula must be used within 1 hour once open.  Maternal Data    Feeding Mother's Current Feeding Choice: Breast Milk and Formula Nipple Type: Slow - flow  LATCH Score  LC did not observe latch due infant recently consuming 15 mls of formula at 1730 pm                  Lactation Tools Discussed/Used Tools: Pump Breast pump type: Double-Electric Breast Pump Pump Education: Setup, frequency, and cleaning;Milk Storage Reason for Pumping: infant is not latching well Per MOB, maybe 3 or 5 minutes some feedings Pumping frequency: MOB will pump every 3 hours for 15 minutes on inital setting.  Interventions Interventions: Skin to skin;Hand  express;Breast compression;Expressed milk;DEBP;Education;Pace feeding;Guidelines for Milk Supply and Pumping Schedule Handout;CDC Guidelines for Breast Pump Cleaning  Discharge    Consult Status Consult Status: Follow-up Date: 04/18/23 Follow-up type: In-patient    Frederico Hamman 04/17/2023, 6:12 PM

## 2023-04-17 NOTE — Progress Notes (Signed)
POSTPARTUM PROGRESS NOTE  Post Partum Day 1  Subjective:  Barbara Montgomery is a 22 y.o. K2H0623 s/p SVD at [redacted]w[redacted]d.  She reports she is doing well. No acute events overnight. She denies any problems with ambulating, voiding or po intake. Denies nausea or vomiting.  Pain is well controlled.  Lochia is appropriate.  Objective: Blood pressure 106/75, pulse 62, temperature 97.7 F (36.5 C), temperature source Oral, resp. rate 18, height 5' (1.524 m), weight 55.8 kg, SpO2 98%, unknown if currently breastfeeding.  Physical Exam:  General: alert, cooperative and no distress Chest: no respiratory distress Heart:regular rate, distal pulses intact Uterine Fundus: firm, appropriately tender DVT Evaluation: No calf swelling or tenderness Extremities: trace edema Skin: warm, dry  Recent Labs    04/16/23 0722 04/17/23 0432  HGB 10.1* 9.1*  HCT 33.4* 30.7*    Assessment/Plan: Barbara Montgomery is a 22 y.o. [id number] s/p SVD at [redacted]w[redacted]d   PPD#1 - Doing well  Routine postpartum care Acute blood loss anemia, clinically significant: 10.1>9.1  Start PO iron supplementation Contraception: condoms  Feeding: both Dispo: Plan for discharge 04/18/23.   LOS: 1 day   [name], MD OB Fellow  04/17/2023, 2:49 PM

## 2023-04-17 NOTE — Anesthesia Postprocedure Evaluation (Signed)
Anesthesia Post Note  Patient: Barbara Montgomery  Procedure(s) Performed: AN AD HOC LABOR EPIDURAL     Patient location during evaluation: Mother Baby Anesthesia Type: Epidural Level of consciousness: awake, oriented and awake and alert Pain management: pain level controlled Vital Signs Assessment: post-procedure vital signs reviewed and stable Respiratory status: spontaneous breathing, respiratory function stable and nonlabored ventilation Cardiovascular status: stable Postop Assessment: no headache, adequate PO intake, able to ambulate, patient able to bend at knees and no apparent nausea or vomiting Anesthetic complications: no   No notable events documented.  Last Vitals:  Vitals:   04/17/23 0308 04/17/23 0638  BP: 110/74 108/65  Pulse: 61 60  Resp: 18 18  Temp: 36.7 C 36.6 C  SpO2: 100% 98%    Last Pain:  Vitals:   04/17/23 0918  TempSrc:   PainSc: 0-No pain   Pain Goal: Patients Stated Pain Goal: 0 (04/16/23 4098)              Epidural/Spinal Function Cutaneous sensation: Normal sensation (04/17/23 0918), Patient able to flex knees: Yes (04/17/23 1191), Patient able to lift hips off bed: Yes (04/17/23 0918), Back pain beyond tenderness at insertion site: No (04/17/23 0918), Progressively worsening motor and/or sensory loss: No (04/17/23 0918), Bowel and/or bladder incontinence post epidural: No (04/17/23 0918)  Demetries Coia

## 2023-04-17 NOTE — Progress Notes (Signed)
POSTPARTUM PROGRESS NOTE  Post Partum Day 1  Subjective:  Barbara Montgomery is a 22 y.o. [id number] s/p SVD at [redacted]w[redacted]d.  No acute events overnight.  Pt denies problems with ambulating, voiding or po intake.  She denies nausea or vomiting.  Pain is well controlled.  She has had flatus. She has not had bowel movement.  Lochia Small.   Objective: Blood pressure 110/74, pulse 61, temperature 98 F (36.7 C), temperature source Oral, resp. rate 18, height 5' (1.524 m), weight 55.8 kg, SpO2 100%, unknown if currently breastfeeding.  Physical Exam:  General: alert, cooperative and no distress Chest: no respiratory distress Abdomen: soft, nontender,  Uterine Fundus: firm, appropriately tender DVT Evaluation: No calf swelling or tenderness Extremities: no edema Skin: warm, dry  Recent Labs    04/16/23 0722  HGB 10.1*  HCT 33.4*    Assessment/Plan: Barbara Montgomery is a 22 y.o. [id number] s/p SVD at [redacted]w[redacted]d   PPD#1 - Doing well GBS - culture pending, s/p penicillin Contraception: condoms Feeding: breast and bottle Dispo: Plan for discharge tomorrow.   LOS: 1 day   Lorayne Bender, MD, CNM 04/17/2023, 4:38 AM

## 2023-04-18 LAB — CULTURE, BETA STREP (GROUP B ONLY)

## 2023-04-26 ENCOUNTER — Telehealth (HOSPITAL_COMMUNITY): Payer: Self-pay | Admitting: *Deleted

## 2023-04-26 NOTE — Telephone Encounter (Signed)
04/26/2023  Name: Barbara Montgomery MRN: [id number] DOB: 2000-11-15  Reason for Call:  Transition of Care Hospital Discharge Call  Contact Status: Patient Contact Status: Message  Language assistant needed: Interpreter Mode: Telephonic Interpreter Interpreter Name: Rosalia Hammers [id number]        Follow-Up Questions:    Inocente Salles Postnatal Depression Scale:  In the Past 7 Days:    PHQ2-9 Depression Scale:     Discharge Follow-up:    Post-discharge interventions: NA  Salena Saner, RN 04/26/2023 17:05

## 2024-01-25 ENCOUNTER — Encounter (HOSPITAL_COMMUNITY): Payer: Self-pay | Admitting: *Deleted

## 2024-01-25 ENCOUNTER — Inpatient Hospital Stay (HOSPITAL_COMMUNITY)
Admission: AD | Admit: 2024-01-25 | Discharge: 2024-01-26 | Disposition: A | Discharge status: Home / Self Care | Attending: Obstetrics and Gynecology | Admitting: Obstetrics and Gynecology

## 2024-01-25 DIAGNOSIS — Z3201 Encounter for pregnancy test, result positive: Secondary | ICD-10-CM | POA: Diagnosis present

## 2024-01-25 DIAGNOSIS — Z3A01 Less than 8 weeks gestation of pregnancy: Secondary | ICD-10-CM | POA: Diagnosis not present

## 2024-01-25 DIAGNOSIS — Z603 Acculturation difficulty: Secondary | ICD-10-CM | POA: Insufficient documentation

## 2024-01-25 DIAGNOSIS — O219 Vomiting of pregnancy, unspecified: Secondary | ICD-10-CM | POA: Insufficient documentation

## 2024-01-25 DIAGNOSIS — Z113 Encounter for screening for infections with a predominantly sexual mode of transmission: Secondary | ICD-10-CM | POA: Diagnosis present

## 2024-01-25 LAB — URINALYSIS, ROUTINE W REFLEX MICROSCOPIC
Bilirubin Urine: NEGATIVE
Glucose, UA: NEGATIVE mg/dL
Hgb urine dipstick: NEGATIVE
Ketones, ur: NEGATIVE mg/dL
Nitrite: NEGATIVE
Protein, ur: NEGATIVE mg/dL
Specific Gravity, Urine: 1.019 (ref 1.005–1.030)
pH: 7 (ref 5.0–8.0)

## 2024-01-25 LAB — WET PREP, GENITAL
Clue Cells Wet Prep HPF POC: NONE SEEN
Sperm: NONE SEEN
Trich, Wet Prep: NONE SEEN
WBC, Wet Prep HPF POC: >=10 — AB (ref <10)
Yeast Wet Prep HPF POC: NONE SEEN

## 2024-01-25 LAB — POCT PREGNANCY, URINE: Preg Test, Ur: POSITIVE — AB

## 2024-01-25 MED ORDER — ONDANSETRON 4 MG PO TBDP
8.0000 mg | ORAL_TABLET | Freq: Once | ORAL | Status: AC
  Administered 2024-01-25: 8 mg via ORAL
  Filled 2024-01-25: qty 2

## 2024-01-25 NOTE — MAU Note (Signed)
 Barbara Montgomery is a 23 y.o. at Unknown here in MAU reporting nausea and vomiting for 2 wks. No pain. Having white vag discharge with odor but denies vag bleeding. Thinks she is pregnant but has not taken upt at home.   LMP: 12/05/23 Onset of complaint: 2wks Pain score: 0 Vitals:   01/25/24 2150 01/25/24 2151  BP:  (!) 101/37  Pulse: 81   Resp: 17   Temp: 98.5 F (36.9 C)   SpO2: 100%      FHT: na  Lab orders placed from triage: upt

## 2024-01-25 NOTE — MAU Provider Note (Signed)
 History     CSN: [id number]  Arrival date and time: 01/25/24 2139   Event Date/Time   First Provider Initiated Contact with Patient 01/25/24 2330      Chief Complaint  Patient presents with   Emesis During Pregnancy    Arionne Iams Scalese is a 23 y.o. G3P2002 at 7.2 weeks by Definite LMP of 12/05/2023 who has .  She presents today for nausea and vomiting.  She states she has not tried any medications and has been unable to drink water.  She reports current nausea and last incident of vomiting around 10pm with 4 incidents total. She denies pain or vaginal bleeding. She does reports some malodorous discharge and requests testing.    OB History     Gravida  3   Para  2   Term  2   Preterm      AB      Living  2      SAB      IAB      Ectopic      Multiple  0   Live Births  2           Past Medical History:  Diagnosis Date   Anemia    Chorioamnionitis 04/06/2021   History of cholestasis during pregnancy    Supervision of high risk pregnancy, antepartum 04/06/2021   Vaginal delivery 04/06/2021    Past Surgical History:  Procedure Laterality Date   APPENDECTOMY      Family History  Problem Relation Age of Onset   Diabetes Neg Hx    Hypertension Neg Hx    Asthma Neg Hx    Cancer Neg Hx    Heart disease Neg Hx     Social History   Tobacco Use   Smoking status: Never   Smokeless tobacco: Never  Vaping Use   Vaping status: Never Used  Substance Use Topics   Alcohol use: Never   Drug use: Never    Allergies: No Known Allergies  Medications Prior to Admission  Medication Sig Dispense Refill Last Dose/Taking   acetaminophen  (TYLENOL ) 325 MG tablet Take 2 tablets (650 mg total) by mouth every 4 (four) hours as needed (pain). 60 tablet 0    ferrous sulfate  325 (65 FE) MG tablet Take 1 tablet (325 mg total) by mouth every other day. 30 tablet 0    hydrocortisone  cream 1 % Apply to affected area 2 times daily 453.6 g 0    ibuprofen   (ADVIL ) 600 MG tablet Take 1 tablet (600 mg total) by mouth every 6 (six) hours as needed. 30 tablet 0    Prenatal Vit-Fe Fumarate-FA (PRENATAL MULTIVITAMIN) TABS tablet Take 1 tablet by mouth daily at 12 noon.      senna-docusate (SENOKOT-S) 8.6-50 MG tablet Take 2 tablets by mouth at bedtime as needed for mild constipation. 30 tablet 0     Review of Systems  Gastrointestinal:  Positive for nausea and vomiting. Negative for abdominal pain, constipation and diarrhea.  Genitourinary:  Positive for vaginal discharge. Negative for difficulty urinating, dysuria and vaginal bleeding.   Physical Exam   Blood pressure (!) 101/37, pulse 81, temperature 98.5 F (36.9 C), resp. rate 17, height 4' 11 (1.499 m), weight 41.5 kg, last menstrual period 12/05/2023, SpO2 100%, unknown if currently breastfeeding.  Physical Exam Vitals and nursing note reviewed.  Constitutional:      Appearance: Normal appearance.  HENT:     Head: Normocephalic and atraumatic.  Eyes:  Conjunctiva/sclera: Conjunctivae normal.  Cardiovascular:     Rate and Rhythm: Normal rate.  Pulmonary:     Effort: Pulmonary effort is normal.  Abdominal:     Palpations: Abdomen is soft.     Tenderness: There is no abdominal tenderness.  Musculoskeletal:        General: Normal range of motion.     Cervical back: Normal range of motion.  Skin:    General: Skin is warm and dry.  Neurological:     Mental Status: She is alert and oriented to person, place, and time.  Psychiatric:        Mood and Affect: Mood normal.        Behavior: Behavior normal.     MAU Course  Procedures Results for orders placed or performed during the hospital encounter of 01/25/24 (from the past 24 hours)  Urinalysis, Routine w reflex microscopic -Urine, Clean Catch     Status: Abnormal   Collection Time: 01/25/24  9:53 PM  Result Value Ref Range   Color, Urine YELLOW YELLOW   APPearance CLOUDY (A) CLEAR   Specific Gravity, Urine 1.019 1.005 -  1.030   pH 7.0 5.0 - 8.0   Glucose, UA NEGATIVE NEGATIVE mg/dL   Hgb urine dipstick NEGATIVE NEGATIVE   Bilirubin Urine NEGATIVE NEGATIVE   Ketones, ur NEGATIVE NEGATIVE mg/dL   Protein, ur NEGATIVE NEGATIVE mg/dL   Nitrite NEGATIVE NEGATIVE   Leukocytes,Ua MODERATE (A) NEGATIVE   RBC / HPF 0-5 0 - 5 RBC/hpf   WBC, UA 11-20 0 - 5 WBC/hpf   Bacteria, UA MANY (A) NONE SEEN   Squamous Epithelial / HPF 0-5 0 - 5 /HPF   Mucus PRESENT   Pregnancy, urine POC     Status: Abnormal   Collection Time: 01/25/24 10:09 PM  Result Value Ref Range   Preg Test, Ur POSITIVE (A) NEGATIVE  Wet prep, genital     Status: Abnormal   Collection Time: 01/25/24 10:41 PM   Specimen: Vaginal  Result Value Ref Range   Yeast Wet Prep HPF POC NONE SEEN NONE SEEN   Trich, Wet Prep NONE SEEN NONE SEEN   Clue Cells Wet Prep HPF POC NONE SEEN NONE SEEN   WBC, Wet Prep HPF POC >=10 (A) <10   Sperm NONE SEEN     MDM -Labs: UA, UC, Wet Prep, GC/CT -Antiemetic -Prescription Assessment and Plan  23 year old G3P2002 at 7.2 weeks N/V Language Barrier  -Patient informed of positive pregnancy test and congratulations given.  -Reviewed POC with patient. -Exam performed.  -Discussed treatment of nausea with Zofran . Patient agreeable.  -Patient to collect cultures by self swab. Await results.  -Interpretations including collection of HPI, review of POC, and results completed with assistance of video interpreter: Sherline [phone].  Harlene LITTIE Duncans 01/25/2024, 11:31 PM   Reassessment (12:14 AM) -Patient reports improvement in nausea with Zofran  dosing. -Discussed intermittent usage at home. Reviewed potential side effect of constipation.   -Patient requests and given work note requesting lifting restrictions. Patient reports lifting between 25-30lbs. Restrictions letter given for 10-15lbs.  -Bleeding precautions reviewed. -Encouraged to call primary office or return to MAU if symptoms worsen or with the onset of  new symptoms. -Discharged to home in stable condition. -Interpretations including reviewing of results, reassessment, and prescriptions completed with assistance of video interpreter Patty [phone].   Harlene LITTIE Duncans MSN, CNM Advanced Practice Provider, Center for [employer]

## 2024-01-26 DIAGNOSIS — Z3A01 Less than 8 weeks gestation of pregnancy: Secondary | ICD-10-CM

## 2024-01-26 DIAGNOSIS — O219 Vomiting of pregnancy, unspecified: Secondary | ICD-10-CM

## 2024-01-26 LAB — GC/CHLAMYDIA PROBE AMP (~~LOC~~) NOT AT ARMC
Chlamydia: NEGATIVE
Comment: NEGATIVE
Comment: NORMAL
Neisseria Gonorrhea: NEGATIVE

## 2024-01-26 LAB — CULTURE, OB URINE: Culture: NO GROWTH

## 2024-01-26 MED ORDER — ONDANSETRON 4 MG PO TBDP: 4.0000 mg | ORAL_TABLET | Freq: Four times a day (QID) | ORAL | 0 refills | Status: AC | PRN

## 2024-01-26 NOTE — Discharge Instructions (Signed)
   [facility] [employer] for [employer] at [facility]  [address] [place], [place] [address]  [phone]  Center for [facility] Healthcare at [facility]  [address], [place], [place] [address]  [phone]  Center for [facility] Healthcare at [facility] [address] [place], [place] [address]  [phone]  Center for [facility] Healthcare at [facility] [address], [place], [place] [address] [phone]  Center for [facility] Healthcare at [facility]  [address] [place], [place] [address]  [phone]  Center for [facility] Healthcare at [facility]  [address] (First floor), [place], [place] [address]  [phone]  Center for [facility] at [facility]  [address] [place], [place], [place] [address]  [phone]  [facility]  [address], [place], [place] [address]  [phone]  [facility]  [address] [place], [place], [place] [address]  [phone]  [facility]  [address] [place], [place] [address]  [phone]  [facility]  [address] [place], [place] [address]  [phone]  [facility]  [address], [place], [place] [address]  [phone]  [facility]   [address] [place], [place] [address]  [phone]  [facility]  [address] [place], [place] [address]   [phone]  [facility] [address], [place], [place] [address] [phone]  [facility]  [address] [place], [place] [address]  [phone]

## 2024-01-26 NOTE — Progress Notes (Signed)
 Written and verbal d/c instructions given and pt voiced understanding. Used Production assistant, radio [phone]

## 2024-02-13 ENCOUNTER — Other Ambulatory Visit: Payer: Self-pay

## 2024-02-13 ENCOUNTER — Ambulatory Visit (INDEPENDENT_AMBULATORY_CARE_PROVIDER_SITE_OTHER)

## 2024-02-13 VITALS — BP 95/64 | HR 103 | Ht 59.0 in | Wt 89.0 lb

## 2024-02-13 DIAGNOSIS — Z3491 Encounter for supervision of normal pregnancy, unspecified, first trimester: Secondary | ICD-10-CM

## 2024-02-13 DIAGNOSIS — Z3A10 10 weeks gestation of pregnancy: Secondary | ICD-10-CM

## 2024-02-13 DIAGNOSIS — Z3490 Encounter for supervision of normal pregnancy, unspecified, unspecified trimester: Secondary | ICD-10-CM | POA: Insufficient documentation

## 2024-02-13 MED ORDER — GOJJI WEIGHT SCALE MISC: 1.0000 | 0 refills | Status: AC

## 2024-02-13 MED ORDER — BLOOD PRESSURE KIT DEVI: 1.0000 | 0 refills | Status: AC

## 2024-02-13 NOTE — Patient Instructions (Signed)

## 2024-02-13 NOTE — Progress Notes (Cosign Needed Addendum)
 New OB Intake  Barbara Montgomery  on 02/13/24 at 11:15 AM EDT  Spanish Interpreter present during visit. I explained I am completing New OB Intake today. We discussed EDD of 09/10/24 based on LMP of 12/05/23. Pt is G3P2002. I reviewed her allergies, medications and Medical/Surgical/OB history.    Patient Active Problem List   Diagnosis Date Noted   Encounter for supervision of low-risk pregnancy 02/13/2024     Concerns addressed today  Delivery Plans Plans to deliver at [facility]. Discussed the nature of our practice with multiple providers including residents and students as well as female and female providers. Due to the size of the practice, the delivering provider may not be the same as those providing prenatal care.   Patient is interested in water birth.  MyChart/Babyscripts MyChart access verified. I explained pt will have some visits in office and some virtually. Babyscripts instructions given and order placed. Patient verifies receipt of registration text/e-mail.   Blood Pressure Cuff/Weight Scale Blood pressure cuff ordered for patient to pick-up from [employer]. Explained after first prenatal appt pt will check weekly and document in Babyscripts. Patient does not have weight scale; order sent to [facility], patient may track weight weekly in Babyscripts.  Anatomy US  Explained first scheduled US  will be around 19 weeks. Anatomy US  will be scheduled and patient will be notified through Mychart.   Is patient a CenteringPregnancy candidate?  Accepted   Is patient a Mom+Baby Combined Care candidate?  Not a candidate   If accepted, confirm patient does not intend to move from the area for at least 12 months, then notify Mom+Baby staff  Is patient a candidate for Babyscripts Optimization? Yes, patient accepted    First visit review I reviewed new OB appt with patient. Explained pt will be seen by Hoyle, NP at first visit 02/19/24. Discussed Jennell genetic  screening with patient. Patient wants Panorama, Horizon and Routine prenatal labs at [facility] OB visit.    Last Pap No results found for: EDMON Sjogren, RN 02/13/2024  12:13 PM

## 2024-02-19 ENCOUNTER — Ambulatory Visit (INDEPENDENT_AMBULATORY_CARE_PROVIDER_SITE_OTHER): Admitting: Student

## 2024-02-19 ENCOUNTER — Other Ambulatory Visit: Payer: Self-pay

## 2024-02-19 VITALS — BP 100/57 | HR 87 | Wt 92.1 lb

## 2024-02-19 DIAGNOSIS — D563 Thalassemia minor: Secondary | ICD-10-CM | POA: Diagnosis not present

## 2024-02-19 DIAGNOSIS — O219 Vomiting of pregnancy, unspecified: Secondary | ICD-10-CM

## 2024-02-19 DIAGNOSIS — Z8759 Personal history of other complications of pregnancy, childbirth and the puerperium: Secondary | ICD-10-CM | POA: Diagnosis not present

## 2024-02-19 DIAGNOSIS — Z1331 Encounter for screening for depression: Secondary | ICD-10-CM | POA: Diagnosis not present

## 2024-02-19 DIAGNOSIS — Z3A10 10 weeks gestation of pregnancy: Secondary | ICD-10-CM | POA: Diagnosis not present

## 2024-02-19 DIAGNOSIS — Z3491 Encounter for supervision of normal pregnancy, unspecified, first trimester: Secondary | ICD-10-CM

## 2024-02-19 DIAGNOSIS — Z8719 Personal history of other diseases of the digestive system: Secondary | ICD-10-CM

## 2024-02-19 DIAGNOSIS — O99611 Diseases of the digestive system complicating pregnancy, first trimester: Secondary | ICD-10-CM

## 2024-02-19 DIAGNOSIS — Z3490 Encounter for supervision of normal pregnancy, unspecified, unspecified trimester: Secondary | ICD-10-CM

## 2024-02-19 DIAGNOSIS — K5900 Constipation, unspecified: Secondary | ICD-10-CM

## 2024-02-19 MED ORDER — PROMETHAZINE HCL 25 MG PO TABS: 25.0000 mg | ORAL_TABLET | Freq: Four times a day (QID) | ORAL | 2 refills | Status: AC | PRN

## 2024-02-19 NOTE — Progress Notes (Signed)
 PRENATAL VISIT NOTE  Subjective:  Barbara Montgomery is a 23 y.o. G3P2002 at [redacted]w[redacted]d being seen today for her first prenatal visit for this pregnancy.  She is currently monitored for the following issues for this low-risk pregnancy and has Encounter for supervision of low-risk pregnancy and Alpha thalassemia silent carrier on their problem list.  Patient reports nausea and vomiting.  Stopped taking Zofran  on Saturday. Has not had a bowel movement for two weeks. Has tried increasing water and eating papaya. Throwing up in the morning and at night . Vag. Bleeding: None.   . Denies leaking of fluid.   She is planning to breastfeed.   The following portions of the patient's history were reviewed and updated as appropriate: allergies, current medications, past family history, past medical history, past social history, past surgical history and problem list.   Objective:   Vitals:   02/19/24 1008  BP: (!) 100/57  Pulse: 87  Weight: 92 lb 1.6 oz (41.8 kg)    Fetal Status:           General:  Alert, oriented and cooperative. Patient is in no acute distress.  Skin: Skin is warm and dry. No rash noted.   Cardiovascular: Normal heart rate and rhythm noted  Respiratory: Normal respiratory effort, no problems with respiration noted. Clear to auscultation.   Abdomen: Soft, gravid, appropriate for gestational age. Normal bowel sounds. Non-tender. Pain/Pressure: Absent     Pelvic: Cervical exam deferred       Normal cervical contour, no lesions, no bleeding following pap, normal discharge  Extremities: Normal range of motion.  Edema: None  Mental Status: Normal mood and affect. Normal behavior. Normal judgment and thought content.    Indications for ASA therapy (per uptodate) One of the following: Previous pregnancy with preeclampsia, especially early onset and with an adverse outcome No Multifetal gestation No Chronic hypertension No Type 1 or 2 diabetes mellitus No Chronic kidney  disease No Autoimmune disease (antiphospholipid syndrome, systemic lupus erythematosus) No  Two or more of the following: Nulliparity No Obesity (body mass index >30 kg/m2) No Family history of preeclampsia in mother or sister No Age >=35 years No Sociodemographic characteristics (African American race, low socioeconomic level) Yes Personal risk factors (eg, previous pregnancy with low birth weight or small for gestational age infant, previous adverse pregnancy outcome [eg, stillbirth], interval >10 years between pregnancies) No  Indications for early GDM screening  First-degree relative with diabetes No BMI >30kg/m2 No Age > 35 No Previous birth of an infant weighing >=4000 g No Gestational diabetes mellitus in a previous pregnancy No Glycated hemoglobin >=5.7 percent (39 mmol/mol), impaired glucose tolerance, or impaired fasting glucose on previous testing No High-risk race/ethnicity (eg, African American, Latino, Native American, Asian American, Pacific Islander) Yes Previous stillbirth of unknown cause No Maternal birthweight > 9 lbs No History of cardiovascular disease No Hypertension or on therapy for hypertension No High-density lipoprotein cholesterol level <35 mg/dL (9.09 mmol/L) and/or a triglyceride level >250 mg/dL (7.17 mmol/L) No Polycystic ovary syndrome No Physical inactivity No Other clinical condition associated with insulin resistance (eg, severe obesity, acanthosis nigricans) No Current use of glucocorticoids No   Early screening tests: FBS, A1C, Random CBG, glucose challenge  Assessment and Plan:  Pregnancy: G3P2002 at [redacted]w[redacted]d 1. Encounter for supervision of low-risk pregnancy, antepartum (Primary) - Excited about pregnancy!  - CBC/D/Plt+RPR+Rh+ABO+RubIgG... - Hemoglobin A1c - PANORAMA PRENATAL TEST - promethazine  (PHENERGAN ) 25 MG tablet; Take 1 tablet (25 mg total) by mouth every  6 (six) hours as needed for nausea or vomiting.  Dispense: 30 tablet; Refill:  2  2. [redacted] weeks gestation of pregnancy - continue routine care  3. Nausea and vomiting during pregnancy - Discussed lifestyle modifications - promethazine  (PHENERGAN ) 25 MG tablet; Take 1 tablet (25 mg total) by mouth every 6 (six) hours as needed for nausea or vomiting.  Dispense: 30 tablet; Refill: 2  4. History of cholestasis during pregnancy - previous pregnancies x2  5. Alpha thalassemia silent carrier - Previous pregnancy   Preterm labor/first trimester warning symptoms and general obstetric precautions including but not limited to vaginal bleeding, contractions, leaking of fluid and fetal movement were reviewed in detail with the patient. Please refer to After Visit Summary for other counseling recommendations.   Discussed the normal visit cadence for prenatal care Accepted Centering Pregnancy - Plan to start in January  Discussed the nature of our practice with multiple providers including residents and students   No follow-ups on file.  Future Appointments  Date Time Provider Department Center  04/24/2024  8:00 AM [facility] PROVIDER 1 [facility]  04/24/2024  8:30 AM [facility] US1 [facility]  05/22/2024  9:00 AM CENTERING PROVIDER [facility]  06/05/2024  9:00 AM CENTERING PROVIDER [facility]    Nat Dauer, NP

## 2024-02-20 LAB — HEMOGLOBIN A1C
Est. average glucose Bld gHb Est-mCnc: 103 mg/dL
Hgb A1c MFr Bld: 5.2 % (ref 4.8–5.6)

## 2024-02-20 LAB — HCV INTERPRETATION

## 2024-02-20 LAB — CBC/D/PLT+RPR+RH+ABO+RUBIGG...
Antibody Screen: NEGATIVE
Basophils Absolute: 0 x10E3/uL (ref 0.0–0.2)
Basos: 0 %
EOS (ABSOLUTE): 0.1 x10E3/uL (ref 0.0–0.4)
Eos: 1 %
HCV Ab: NONREACTIVE
HIV Screen 4th Generation wRfx: NONREACTIVE
Hematocrit: 39.1 % (ref 34.0–46.6)
Hemoglobin: 12.5 g/dL (ref 11.1–15.9)
Hepatitis B Surface Ag: NEGATIVE
Immature Grans (Abs): 0 x10E3/uL (ref 0.0–0.1)
Immature Granulocytes: 0 %
Lymphocytes Absolute: 1.7 x10E3/uL (ref 0.7–3.1)
Lymphs: 19 %
MCH: 27.3 pg (ref 26.6–33.0)
MCHC: 32 g/dL (ref 31.5–35.7)
MCV: 85 fL (ref 79–97)
Monocytes Absolute: 0.7 x10E3/uL (ref 0.1–0.9)
Monocytes: 8 %
Neutrophils Absolute: 6.6 x10E3/uL (ref 1.4–7.0)
Neutrophils: 72 %
Platelets: 320 x10E3/uL (ref 150–450)
RBC: 4.58 x10E6/uL (ref 3.77–5.28)
RDW: 13.7 % (ref 11.7–15.4)
RPR Ser Ql: NONREACTIVE
Rh Factor: POSITIVE
Rubella Antibodies, IGG: 5.12 {index} (ref >0.99)
WBC: 9.2 x10E3/uL (ref 3.4–10.8)

## 2024-02-21 NOTE — Progress Notes (Signed)
 02/21/24 Addendum: pregnancy risk form completed [facility] PEAK

## 2024-02-24 LAB — PANORAMA PRENATAL TEST FULL PANEL:PANORAMA TEST PLUS 5 ADDITIONAL MICRODELETIONS: FETAL FRACTION: 12.2

## 2024-03-19 ENCOUNTER — Encounter: Admitting: Obstetrics and Gynecology

## 2024-03-20 ENCOUNTER — Encounter: Admitting: Obstetrics and Gynecology

## 2024-04-19 ENCOUNTER — Encounter: Admitting: Family Medicine

## 2024-04-24 ENCOUNTER — Ambulatory Visit

## 2024-04-24 ENCOUNTER — Ambulatory Visit: Attending: Student

## 2024-04-24 VITALS — BP 98/50 | HR 89

## 2024-04-24 DIAGNOSIS — Z3689 Encounter for other specified antenatal screening: Secondary | ICD-10-CM | POA: Insufficient documentation

## 2024-04-24 DIAGNOSIS — Z8719 Personal history of other diseases of the digestive system: Secondary | ICD-10-CM

## 2024-04-24 DIAGNOSIS — D563 Thalassemia minor: Secondary | ICD-10-CM | POA: Insufficient documentation

## 2024-04-24 DIAGNOSIS — Z3A20 20 weeks gestation of pregnancy: Secondary | ICD-10-CM

## 2024-04-24 DIAGNOSIS — Z8759 Personal history of other complications of pregnancy, childbirth and the puerperium: Secondary | ICD-10-CM | POA: Insufficient documentation

## 2024-04-24 DIAGNOSIS — Z3490 Encounter for supervision of normal pregnancy, unspecified, unspecified trimester: Secondary | ICD-10-CM | POA: Diagnosis not present

## 2024-04-24 NOTE — Progress Notes (Signed)
 Patient information  Patient Name: Barbara Montgomery  Patient MRN:   [id number]  Referring practice: MFM Referring Provider: [facility]  Problem List   Patient Active Problem List   Diagnosis Date Noted   Alpha thalassemia silent carrier 02/19/2024   History of cholestasis during pregnancy 02/19/2024   Encounter for supervision of low-risk pregnancy 02/13/2024   Maternal Fetal Medicine Consult Barbara Montgomery is a 23 y.o. G3P2002 at [redacted]w[redacted]d here for ultrasound and consultation. She had low risk aneuploidy screening of a female fetus. Carrier screening was positive for alpha thal silent carrier. Maternal serum AFP n/a. She has no acute concerns.   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 The recurrence risk of ICP was reviewed at approximately 70%. Specific risk factors discussed included prior history of ICP, estrogen exposure in pregnancy, possible genetic predisposition, and multifactorial hepatic transport dysfunction. The importance of serial bile acid and CMP monitoring if symptoms develop was reviewed for both fetal risk stratification and timing of delivery. Patient is also an alpha thalassemia silent carrier with the father of the baby testing negative, resulting in no expected fetal risk. Anatomy ultrasound today was normal, and an early growth ultrasound was performed at 10 weeks. Obstetric history is otherwise notable for two prior uncomplicated deliveries aside from cholestasis. Patient reports no current pruritus and endorses good fetal movement.  The anatomic structures that were well seen appear normal. The anatomic survey is complete. EFW 322 grams (34%).  Recommendations -Monitor closely for pruritus -Check  bile acids and CMP promptly if symptoms develop -Serial bile acid and CMP monitoring if ICP recurs -Antenatal testing if ICP is diagnosed or other indications arise -Routine prenatal care based on current low-risk status -Delivery planning based on bile acid levels and clinical course if ICP recurs  60 minutes of time was spent reviewing the patient's chart including labs, imaging and documentation.  At least 50% of this time was spent with direct patient care discussing the diagnosis, management and prognosis of her care.  An in person interpreter was used in the patient's language of preference for today's visit.  Review of Systems: A review of systems was performed and was negative except per HPI   Past Obstetrical History:  OB History  Gravida Para Term Preterm AB Living  3 2 2   2   SAB IAB Ectopic Multiple Live Births     0 2    # Outcome Date GA Lbr Len/2nd Weight Sex Type Anes PTL Lv  3 Current           2 Term 04/16/23 [redacted]w[redacted]d 07:35 / 00:04 6 lb 12.3 oz (3.07 kg) F Vag-Spont EPI  LIV  1 Term 04/04/21 [redacted]w[redacted]d 07:14 / 00:45 6 lb 10 oz (3.005 kg) F Vag-Spont EPI  LIV     Past Medical History:  Past Medical History:  Diagnosis Date   Anemia    Chorioamnionitis 04/06/2021   History of cholestasis during pregnancy    Supervision of high risk pregnancy, antepartum 04/06/2021   Vaginal delivery 04/06/2021     Past Surgical History:    Past Surgical History:  Procedure Laterality Date   APPENDECTOMY       Home Medications:   Current Outpatient Medications on File Prior to Visit  Medication Sig Dispense  Refill   Prenatal Vit-Fe Fumarate-FA (PRENATAL MULTIVITAMIN) TABS tablet Take 1 tablet by mouth daily at 12 noon.     promethazine  (PHENERGAN ) 25 MG tablet Take 1 tablet (25 mg total) by mouth every 6 (six) hours as needed for nausea or vomiting. 30 tablet 2   acetaminophen  (TYLENOL ) 325 MG tablet Take 2 tablets (650 mg total) by mouth every 4 (four) hours as needed (pain).  (Patient not taking: Reported on 02/13/2024) 60 tablet 0   Blood Pressure Monitoring (BLOOD PRESSURE KIT) DEVI 1 Device by Does not apply route once a week. 1 each 0   hydrocortisone  cream 1 % Apply to affected area 2 times daily (Patient not taking: Reported on 02/13/2024) 453.6 g 0   Misc. Devices (GOJJI WEIGHT SCALE) MISC 1 Device by Does not apply route once a week. 1 each 0   ondansetron  (ZOFRAN -ODT) 4 MG disintegrating tablet Take 1-2 tablets (4-8 mg total) by mouth every 6 (six) hours as needed for nausea or vomiting. (Patient not taking: Reported on 02/19/2024) 30 tablet 0   No current facility-administered medications on file prior to visit.      Allergies:   No Known Allergies   Physical Exam:   Vitals:   04/24/24 0818  BP: (!) 98/50  Pulse: 89   Sitting comfortably on the sonogram table Nonlabored breathing Normal rate and rhythm Abdomen is nontender  Thank you for the opportunity to be involved with this patient's care. Please let us  know if we can be of any further assistance.   Delora Smaller MFM, [facility]   04/24/2024  9:33 AM

## 2024-05-22 ENCOUNTER — Encounter: Admitting: Family Medicine

## 2024-05-22 NOTE — Progress Notes (Deleted)
" ° °  PRENATAL VISIT NOTE  Subjective:  Barbara Montgomery is a 24 y.o. G3P2002 at [redacted]w[redacted]d being seen today for ongoing prenatal care.  She is currently monitored for the following issues for this {Blank single:19197::high-risk,low-risk} pregnancy and has Encounter for supervision of low-risk pregnancy; Alpha thalassemia silent carrier; and History of cholestasis during pregnancy on their problem list.  Patient reports {sx:14538}.   .  .   . Denies leaking of fluid.   The following portions of the patient's history were reviewed and updated as appropriate: allergies, current medications, past family history, past medical history, past social history, past surgical history and problem list.   Objective:   There were no vitals filed for this visit.  Fetal Status:           General: Alert, oriented and cooperative. Patient is in no acute distress.  Skin: Skin is warm and dry. No rash noted.   Cardiovascular: Normal heart rate noted  Respiratory: Normal respiratory effort, no problems with respiration noted  Abdomen: Soft, gravid, appropriate for gestational age.        Pelvic: {Blank single:19197::Cervical exam performed in the presence of a chaperone,Cervical exam deferred}        Extremities: Normal range of motion.     Mental Status: Normal mood and affect. Normal behavior. Normal judgment and thought content.      02/19/2024   12:15 PM 02/13/2024    3:26 PM  Depression screen PHQ 2/9  Decreased Interest 3 3  Down, Depressed, Hopeless 0 0  PHQ - 2 Score 3 3  Altered sleeping 0 0  Tired, decreased energy 0 3  Change in appetite 0 0  Feeling bad or failure about yourself  0 0  Trouble concentrating 0 0  Moving slowly or fidgety/restless 0 0  Suicidal thoughts 0 0  PHQ-9 Score 3  6      Data saved with a previous flowsheet row definition        02/19/2024   12:17 PM 02/13/2024    3:27 PM  GAD 7 : Generalized Anxiety Score  Nervous, Anxious, on Edge 0 0   Control/stop worrying 0 0  Worry too much - different things 0 0  Trouble relaxing 0 0  Restless 0 0  Easily annoyed or irritable 0 0  Afraid - awful might happen 0 0  Total GAD 7 Score 0 0    Assessment and Plan:  Pregnancy: G3P2002 at [redacted]w[redacted]d 1. History of cholestasis during pregnancy (Primary) ***  2. Encounter for supervision of low-risk pregnancy in second trimester ***  3. [redacted] weeks gestation of pregnancy ***  {Blank single:19197::Term,Preterm} labor symptoms and general obstetric precautions including but not limited to vaginal bleeding, contractions, leaking of fluid and fetal movement were reviewed in detail with the patient. Please refer to After Visit Summary for other counseling recommendations.   No follow-ups on file.  Future Appointments  Date Time Provider Department Center  05/22/2024  9:00 AM [place] Suzen Octave, MD [facility]  06/05/2024  9:00 AM CENTERING PROVIDER [facility]  06/19/2024  9:00 AM CENTERING PROVIDER [facility]  07/03/2024  9:00 AM CENTERING PROVIDER [facility]  07/17/2024  9:00 AM CENTERING PROVIDER [facility]  07/31/2024  9:00 AM CENTERING PROVIDER [facility]  08/14/2024  9:00 AM CENTERING PROVIDER [facility]  08/28/2024  9:00 AM CENTERING PROVIDER [facility]  09/11/2024  9:00 AM CENTERING PROVIDER [facility]    Suzen Octave Eldonna, MD "

## 2024-06-04 ENCOUNTER — Encounter: Payer: Self-pay | Admitting: *Deleted

## 2024-06-05 ENCOUNTER — Ambulatory Visit: Payer: Self-pay | Admitting: Family Medicine

## 2024-06-05 VITALS — BP 102/66 | HR 83 | Wt 112.6 lb

## 2024-06-05 DIAGNOSIS — Z8719 Personal history of other diseases of the digestive system: Secondary | ICD-10-CM

## 2024-06-05 DIAGNOSIS — Z3A26 26 weeks gestation of pregnancy: Secondary | ICD-10-CM

## 2024-06-05 DIAGNOSIS — Z3493 Encounter for supervision of normal pregnancy, unspecified, third trimester: Secondary | ICD-10-CM

## 2024-06-05 NOTE — Progress Notes (Unsigned)
 "  PRENATAL VISIT NOTE  Subjective:  Barbara Montgomery is a 24 y.o. G3P2002 at [redacted]w[redacted]d being seen today for ongoing prenatal care.  She is currently monitored for the following issues for this {Blank single:19197::high-risk,low-risk} pregnancy and has Encounter for supervision of low-risk pregnancy; Alpha thalassemia silent carrier; and History of cholestasis during pregnancy on their problem list.  Patient reports {sx:14538}.  Contractions: Not present. Vag. Bleeding: None.  Movement: Present. Denies leaking of fluid.  Nightime itching, hand, feet  The following portions of the patient's history were reviewed and updated as appropriate: allergies, current medications, past family history, past medical history, past social history, past surgical history and problem list.   Objective:   Vitals:   06/05/24 0937  BP: 102/66  Pulse: 83  Weight: 112 lb 9.6 oz (51.1 kg)    Fetal Status:      Movement: Present    General: Alert, oriented and cooperative. Patient is in no acute distress.  Skin: Skin is warm and dry. No rash noted.   Cardiovascular: Normal heart rate noted  Respiratory: Normal respiratory effort, no problems with respiration noted  Abdomen: Soft, gravid, appropriate for gestational age.  Pain/Pressure: Present     Pelvic: {Blank single:19197::Cervical exam performed in the presence of a chaperone,Cervical exam deferred}        Extremities: Normal range of motion.     Mental Status: Normal mood and affect. Normal behavior. Normal judgment and thought content.      02/19/2024   12:15 PM 02/13/2024    3:26 PM  Depression screen PHQ 2/9  Decreased Interest 3 3  Down, Depressed, Hopeless 0 0  PHQ - 2 Score 3 3  Altered sleeping 0 0  Tired, decreased energy 0 3  Change in appetite 0 0  Feeling bad or failure about yourself  0 0  Trouble concentrating 0 0  Moving slowly or fidgety/restless 0 0  Suicidal thoughts 0 0  PHQ-9 Score 3  6      Data saved with  a previous flowsheet row definition        02/19/2024   12:17 PM 02/13/2024    3:27 PM  GAD 7 : Generalized Anxiety Score  Nervous, Anxious, on Edge 0  0   Control/stop worrying 0  0   Worry too much - different things 0  0   Trouble relaxing 0  0   Restless 0  0   Easily annoyed or irritable 0  0   Afraid - awful might happen 0  0   Total GAD 7 Score 0 0     Data saved with a previous flowsheet row definition    Assessment and Plan:  Pregnancy: G3P2002 at [redacted]w[redacted]d 1. History of cholestasis during pregnancy (Primary) ***  2. Encounter for supervision of low-risk pregnancy in third trimester ***  3. [redacted] weeks gestation of pregnancy ***  {Blank single:19197::Term,Preterm} labor symptoms and general obstetric precautions including but not limited to vaginal bleeding, contractions, leaking of fluid and fetal movement were reviewed in detail with the patient. Please refer to After Visit Summary for other counseling recommendations.   No follow-ups on file.  Future Appointments  Date Time Provider Department Center  06/18/2024  9:20 AM [facility]  06/19/2024  9:00 AM CENTERING PROVIDER [facility]  07/03/2024  9:00 AM CENTERING PROVIDER [facility]  07/17/2024  9:00 AM CENTERING PROVIDER [facility]  07/31/2024  9:00 AM CENTERING PROVIDER [facility]  08/14/2024  9:00 AM  CENTERING PROVIDER [facility]  08/28/2024  9:00 AM CENTERING PROVIDER [facility]  09/11/2024  9:00 AM CENTERING PROVIDER [facility]    Suzen Maryan Masters, MD "

## 2024-06-07 ENCOUNTER — Ambulatory Visit: Payer: Self-pay | Admitting: Family Medicine

## 2024-06-07 LAB — BILE ACIDS, TOTAL: Bile Acids Total: 1.3 umol/L (ref 0.0–10.0)

## 2024-06-17 ENCOUNTER — Other Ambulatory Visit: Payer: Self-pay

## 2024-06-17 DIAGNOSIS — Z3493 Encounter for supervision of normal pregnancy, unspecified, third trimester: Secondary | ICD-10-CM

## 2024-06-18 ENCOUNTER — Other Ambulatory Visit

## 2024-06-18 ENCOUNTER — Other Ambulatory Visit: Payer: Self-pay

## 2024-06-18 DIAGNOSIS — Z3493 Encounter for supervision of normal pregnancy, unspecified, third trimester: Secondary | ICD-10-CM

## 2024-06-19 ENCOUNTER — Encounter: Admitting: Family Medicine

## 2024-06-19 LAB — CBC
Hematocrit: 31.4 % — ABNORMAL LOW (ref 34.0–46.6)
Hemoglobin: 9.5 g/dL — ABNORMAL LOW (ref 11.1–15.9)
MCH: 24.6 pg — ABNORMAL LOW (ref 26.6–33.0)
MCHC: 30.3 g/dL — ABNORMAL LOW (ref 31.5–35.7)
MCV: 81 fL (ref 79–97)
Platelets: 247 10*3/uL (ref 150–450)
RBC: 3.86 x10E6/uL (ref 3.77–5.28)
RDW: 12.7 % (ref 11.7–15.4)
WBC: 8 10*3/uL (ref 3.4–10.8)

## 2024-06-19 LAB — SYPHILIS: RPR W/REFLEX TO RPR TITER AND TREPONEMAL ANTIBODIES, TRADITIONAL SCREENING AND DIAGNOSIS ALGORITHM: RPR Ser Ql: NONREACTIVE

## 2024-06-19 LAB — GLUCOSE TOLERANCE, 2 HOURS W/ 1HR
Glucose, 1 hour: 93 mg/dL (ref 70–179)
Glucose, 2 hour: 72 mg/dL (ref 70–152)
Glucose, Fasting: 74 mg/dL (ref 70–91)

## 2024-06-19 LAB — HIV ANTIBODY (ROUTINE TESTING W REFLEX): HIV Screen 4th Generation wRfx: NONREACTIVE

## 2024-07-03 ENCOUNTER — Ambulatory Visit

## 2024-07-03 ENCOUNTER — Other Ambulatory Visit
